# Patient Record
Sex: Male | Born: 1955 | Race: Black or African American | Hispanic: No | Marital: Married | Smoking: Former smoker
Health system: Southern US, Community
[De-identification: ages and names within clinical notes are randomized; demographics above are authoritative.]

## PROBLEM LIST (undated history)

## (undated) DIAGNOSIS — E119 Type 2 diabetes mellitus without complications: Secondary | ICD-10-CM

## (undated) DIAGNOSIS — Z87442 Personal history of urinary calculi: Secondary | ICD-10-CM

## (undated) DIAGNOSIS — J189 Pneumonia, unspecified organism: Secondary | ICD-10-CM

## (undated) DIAGNOSIS — E785 Hyperlipidemia, unspecified: Secondary | ICD-10-CM

## (undated) DIAGNOSIS — M1990 Unspecified osteoarthritis, unspecified site: Secondary | ICD-10-CM

## (undated) DIAGNOSIS — W3400XA Accidental discharge from unspecified firearms or gun, initial encounter: Secondary | ICD-10-CM

## (undated) DIAGNOSIS — I517 Cardiomegaly: Secondary | ICD-10-CM

## (undated) DIAGNOSIS — R201 Hypoesthesia of skin: Secondary | ICD-10-CM

## (undated) HISTORY — DX: Hyperlipidemia, unspecified: E78.5

## (undated) HISTORY — PX: NO PAST SURGERIES: SHX2092

---

## 1984-05-10 DIAGNOSIS — W3400XA Accidental discharge from unspecified firearms or gun, initial encounter: Secondary | ICD-10-CM

## 1984-05-10 HISTORY — DX: Accidental discharge from unspecified firearms or gun, initial encounter: W34.00XA

## 1998-05-23 ENCOUNTER — Emergency Department (HOSPITAL_COMMUNITY): Admission: EM | Admit: 1998-05-23 | Discharge: 1998-05-23 | Payer: Self-pay | Admitting: Emergency Medicine

## 2005-10-26 ENCOUNTER — Emergency Department (HOSPITAL_COMMUNITY): Admission: EM | Admit: 2005-10-26 | Discharge: 2005-10-26 | Payer: Self-pay | Admitting: Family Medicine

## 2005-11-10 ENCOUNTER — Emergency Department (HOSPITAL_COMMUNITY): Admission: EM | Admit: 2005-11-10 | Discharge: 2005-11-10 | Payer: Self-pay | Admitting: Emergency Medicine

## 2008-07-09 ENCOUNTER — Emergency Department (HOSPITAL_COMMUNITY): Admission: EM | Admit: 2008-07-09 | Discharge: 2008-07-09 | Payer: Self-pay | Admitting: Family Medicine

## 2010-08-20 LAB — POCT I-STAT, CHEM 8
Chloride: 105 mEq/L (ref 96–112)
HCT: 43 % (ref 39.0–52.0)
Hemoglobin: 14.6 g/dL (ref 13.0–17.0)
Sodium: 140 mEq/L (ref 135–145)

## 2013-02-09 ENCOUNTER — Encounter (HOSPITAL_COMMUNITY): Payer: Self-pay | Admitting: Emergency Medicine

## 2013-02-09 ENCOUNTER — Emergency Department (INDEPENDENT_AMBULATORY_CARE_PROVIDER_SITE_OTHER)
Admission: EM | Admit: 2013-02-09 | Discharge: 2013-02-09 | Disposition: A | Discharge status: Home / Self Care | Payer: Medicare Other | Source: Home / Self Care | Attending: Family Medicine | Admitting: Family Medicine

## 2013-02-09 DIAGNOSIS — E119 Type 2 diabetes mellitus without complications: Secondary | ICD-10-CM

## 2013-02-09 LAB — POCT I-STAT, CHEM 8
Creatinine, Ser: 1.1 mg/dL (ref 0.50–1.35)
HCT: 41 % (ref 39.0–52.0)
Hemoglobin: 13.9 g/dL (ref 13.0–17.0)
Sodium: 135 mEq/L (ref 135–145)
TCO2: 25 mmol/L (ref 0–100)

## 2013-02-09 MED ORDER — METFORMIN HCL 500 MG PO TABS: 500.0000 mg | ORAL_TABLET | Freq: Two times a day (BID) | ORAL | Status: DC

## 2013-02-09 NOTE — ED Provider Notes (Signed)
Mark Cook is a 57 y.o. male who presents to Urgent Care today for polyuria. Patient notes frequent urination over the last week or so. He occasionally feels somewhat dizzy. He checks his blood sugar with a relative's glucometer and noted her sugar readings in the 400s. He suspects that he may have diabetes. He has never been diagnosed before. He denies any chest pains palpitations or syncope. He notes that he frequently drinks sugar sweetened beverages. He feels well otherwise no nausea vomiting or diarrhea   History reviewed. No pertinent past medical history. History  Substance Use Topics  . Smoking status: Never Smoker   . Smokeless tobacco: Not on file  . Alcohol Use: Yes   ROS as above Medications reviewed. No current facility-administered medications for this encounter.   Current Outpatient Prescriptions  Medication Sig Dispense Refill  . metFORMIN (GLUCOPHAGE) 500 MG tablet Take 1 tablet (500 mg total) by mouth 2 (two) times daily with a meal.  60 tablet  0    Exam:  BP 119/70  Pulse 77  Temp(Src) 97.9 F (36.6 C) (Oral)  Resp 16  SpO2 98% Gen: Well NAD HEENT: EOMI,  MMM Lungs: CTABL Nl WOB Heart: RRR no MRG Abd: NABS, NT, ND Exts: Non edematous BL  LE, warm and well perfused.   Results for orders placed during the hospital encounter of 02/09/13 (from the past 24 hour(s))  POCT I-STAT, CHEM 8     Status: Abnormal   Collection Time    02/09/13  1:33 PM      Result Value Range   Sodium 135  135 - 145 mEq/L   Potassium 4.3  3.5 - 5.1 mEq/L   Chloride 99  96 - 112 mEq/L   BUN 13  6 - 23 mg/dL   Creatinine, Ser 1.61  0.50 - 1.35 mg/dL   Glucose, Bld 096 (*) 70 - 99 mg/dL   Calcium, Ion 0.45  4.09 - 1.23 mmol/L   TCO2 25  0 - 100 mmol/L   Hemoglobin 13.9  13.0 - 17.0 g/dL   HCT 81.1  91.4 - 78.2 %   No results found.  Assessment and Plan: 57 y.o. male with diabetes. Based on symptom of polyuria and blood glucose level of 337.  Plan: Initiate metformin low-dose,  and prescribed a glucometer with test strips. \ I have scheduled the patient an appointment with Dr. Piedad Climes of the [facility] on Wednesday of next week.  Discussed warning signs or symptoms. Please see discharge instructions. Patient expresses understanding.      Rodolph Bong, MD 02/09/13 [phone]

## 2013-02-09 NOTE — ED Notes (Signed)
Patient reports frequent urination and dizziness for one week.  Sister checked his "sugar" yesterday and reported cbg 490.  Patient denies history.

## 2013-02-09 NOTE — ED Notes (Signed)
[facility] called in regards to the diagnostic code for testing strips. Per Hayden Rasmussen NP ,the code was 250.01 relayed same to pharmacy.

## 2013-02-12 NOTE — ED Notes (Addendum)
[facility] pharmacy called and said they need a diagnosis code to fill the strips.  They said they talked to [place] on Friday.  She said it can't be called, it has to be written and signed. She said pt. is there now. They faxed the order to [place].  I gave it to Dr. Denyse Amass. He wrote 250.92 -uncontrolled diabetes on it and I faxed it back [phone] and confirmation recieved.  I called back and they said they are filling it now and thanked me. Vassie Moselle 02/12/2013

## 2013-02-14 ENCOUNTER — Emergency Department (INDEPENDENT_AMBULATORY_CARE_PROVIDER_SITE_OTHER)
Admission: EM | Admit: 2013-02-14 | Discharge: 2013-02-14 | Disposition: A | Discharge status: Home / Self Care | Payer: Medicare Other | Source: Home / Self Care | Attending: Emergency Medicine | Admitting: Emergency Medicine

## 2013-02-14 ENCOUNTER — Emergency Department (INDEPENDENT_AMBULATORY_CARE_PROVIDER_SITE_OTHER): Payer: Medicare Other

## 2013-02-14 ENCOUNTER — Encounter (HOSPITAL_COMMUNITY): Payer: Self-pay | Admitting: Emergency Medicine

## 2013-02-14 DIAGNOSIS — R079 Chest pain, unspecified: Secondary | ICD-10-CM

## 2013-02-14 DIAGNOSIS — S2341XA Sprain of ribs, initial encounter: Secondary | ICD-10-CM

## 2013-02-14 DIAGNOSIS — S2329XA Dislocation of other parts of thorax, initial encounter: Secondary | ICD-10-CM

## 2013-02-14 HISTORY — DX: Type 2 diabetes mellitus without complications: E11.9

## 2013-02-14 MED ORDER — HYDROCODONE-ACETAMINOPHEN 5-325 MG PO TABS
ORAL_TABLET | ORAL | Status: AC
  Filled 2013-02-14: qty 2

## 2013-02-14 MED ORDER — OXYCODONE-ACETAMINOPHEN 5-325 MG PO TABS: ORAL_TABLET | ORAL | Status: DC

## 2013-02-14 MED ORDER — HYDROCODONE-ACETAMINOPHEN 5-325 MG PO TABS
2.0000 | ORAL_TABLET | Freq: Once | ORAL | Status: AC
  Administered 2013-02-14: 2 via ORAL

## 2013-02-14 NOTE — ED Notes (Signed)
Pt reports  She  Swallowed    A    Tongue  Ring  sev  Hours  Ago  She is in no  Distress  Airway is  Intact         No  Coughing  She  Is  Speaking in  Complete  sentances

## 2013-02-14 NOTE — ED Notes (Signed)
pT  States  Felt  A  Pop  sev  Days  Ago  While  Turning        He  Now  Has  Pain      When  He   Takes  Deep  Breath

## 2013-02-14 NOTE — ED Provider Notes (Signed)
Chief Complaint:   Chief Complaint  Patient presents with  . Back Pain    History of Present Illness:   Mark Cook is a 57 year old male who presents with a two-day history of right chest and flank pain which extends from the axilla to the iliac crest. Seems centered around the lower, left, lateral rib cage area which is tender to touch. The pain is worse with movement, twisting, bending, deep inspiration, coughing, sneezing, and laughing. The patient denies any injury. He states he just twisted and felt a pop sensation in his lateral chest followed by severe pain. The pain is rated 8/10 in intensity. It sometimes radiates to the abdomen. He denies any pain in the head, neck, anterior chest, radiating down the left arm or left leg. There is no numbness, tingling, or weakness. He denies any shortness of breath, cough, or hemoptysis.  He was just seen here last week and diagnosed with new-onset diabetes and placed on metformin. He's been taking this regularly. His baseline blood sugar was 377. He's not been checking his blood sugars, but feels his blood sugars better. He denies polyuria, polydipsia, or blurry vision.  Review of Systems:  Other than noted above, the patient denies any of the following symptoms. Systemic:  No fever, chills, sweats, or fatigue. ENT:  No nasal congestion, rhinorrhea, or sore throat. Pulmonary:  No cough, wheezing, shortness of breath, sputum production, hemoptysis. Cardiac:  No palpitations, rapid heartbeat, dizziness, presyncope or syncope. GI:  No abdominal pain, heartburn, nausea, or vomiting. Ext:  No leg pain or swelling.  PMFSH:  Past medical history, family history, social history, meds, and allergies were reviewed and updated as needed.   Physical Exam:   Vital signs:  BP 127/78  Pulse 68  Temp(Src) 98.1 F (36.7 C) (Oral)  Resp 28  SpO2 97% Gen:  Alert, oriented, in no distress, skin warm and dry. Eye:  PERRL, lids and conjunctivas normal.  Sclera  non-icteric. ENT:  Mucous membranes moist, pharynx clear. Neck:  Supple, no adenopathy or tenderness.  No JVD. Lungs:  Clear to auscultation, no wheezes, rales or rhonchi.  No respiratory distress. Heart:  Regular rhythm.  No gallops, murmers, clicks or rubs. Chest:  He has exquisite chest wall tenderness to palpation over the left, lower, lateral chest area. No swelling, bruising, or deformity. Abdomen:  Soft, nontender, no organomegaly or mass.  Bowel sounds normal.  No pulsatile abdominal mass or bruit. Ext:  No edema.  No calf tenderness and Homann's sign negative.  Pulses full and equal. Skin:  Warm and dry.  No rash.  Labs:   Results for orders placed during the hospital encounter of 02/14/13  GLUCOSE, CAPILLARY      Result Value Range   Glucose-Capillary 175 (*) 70 - 99 mg/dL     Radiology:  Dg Ribs Unilateral W/chest Right  02/14/2013   CLINICAL DATA:  Right-sided flank pain  EXAM: RIGHT RIBS AND CHEST - 3+ VIEW  COMPARISON:  None.  FINDINGS: Low volumes. Bibasilar atelectasis right greater than left. Upper normal heart size. No acute fracture. No dislocation. Ribs are grossly intact  IMPRESSION: No active cardiopulmonary disease. No obvious acute rib fracture   Electronically Signed   By: Maryclare Bean M.D.   On: 02/14/2013 16:10   I reviewed the images independently and personally and concur with the radiologist's findings.  Course in Urgent Care Center:   Given Norco 5/325 2 tablets by mouth for pain.  Assessment:  The encounter  diagnosis was Costochondral separation, initial encounter.  No evidence of rib fracture on x-Stepen. The point of maximal tenderness as identified by a BB marker was adjacent to the costochondral junction, suggesting costochondral separation.  Plan:   1.  Meds:  The following meds were prescribed:   Discharge Medication List as of 02/14/2013  4:36 PM    START taking these medications   Details  oxyCODONE-acetaminophen (PERCOCET) 5-325 MG per tablet 1 to 2  tablets every 6 hours as needed for pain., Print        2.  Patient Education/Counseling:  The patient was given appropriate handouts, self care instructions, and instructed in symptomatic relief.  Told to avoid vigorous activity and do deep breathing with a cough 4 times daily.  3.  Follow up:  The patient was told to follow up if no better in 3 to 4 days, if becoming worse in any way, and give an an some red flag symptoms such as fever or difficulty breathing which would prompt immediate return.  Follow up here as needed.     Reuben Likes, MD 02/14/13 [phone]

## 2013-02-15 ENCOUNTER — Encounter: Payer: Self-pay | Admitting: Emergency Medicine

## 2013-02-15 ENCOUNTER — Ambulatory Visit (INDEPENDENT_AMBULATORY_CARE_PROVIDER_SITE_OTHER): Payer: Medicare Other | Admitting: Emergency Medicine

## 2013-02-15 VITALS — BP 118/70 | HR 72 | Temp 97.6°F | Ht 76.0 in | Wt 250.0 lb

## 2013-02-15 DIAGNOSIS — E119 Type 2 diabetes mellitus without complications: Secondary | ICD-10-CM | POA: Insufficient documentation

## 2013-02-15 DIAGNOSIS — IMO0001 Reserved for inherently not codable concepts without codable children: Secondary | ICD-10-CM

## 2013-02-15 DIAGNOSIS — R0789 Other chest pain: Secondary | ICD-10-CM

## 2013-02-15 DIAGNOSIS — R079 Chest pain, unspecified: Secondary | ICD-10-CM

## 2013-02-15 DIAGNOSIS — R0781 Pleurodynia: Secondary | ICD-10-CM | POA: Insufficient documentation

## 2013-02-15 LAB — POCT GLYCOSYLATED HEMOGLOBIN (HGB A1C): Hemoglobin A1C: 9.6

## 2013-02-15 LAB — POCT UA - MICROALBUMIN
Albumin/Creatinine Ratio, Urine, POC: <30
Creatinine, POC: 200 mg/dL

## 2013-02-15 MED ORDER — ACCU-CHEK SOFT TOUCH LANCETS MISC: Status: DC

## 2013-02-15 MED ORDER — METFORMIN HCL 500 MG PO TABS: 500.0000 mg | ORAL_TABLET | Freq: Two times a day (BID) | ORAL | Status: DC

## 2013-02-15 MED ORDER — GLUCOSE BLOOD VI STRP: ORAL_STRIP | Status: DC

## 2013-02-15 NOTE — Patient Instructions (Signed)
It was nice to meet you!  Keep taking the metformin 1 pill with breakfast and 1 pill with dinner. You only need to check your sugar once or twice a week first thing in the morning.  You can also check if you think your sugar is too low or getting too high. Please get a pumice stone (at [facility] or the drugstore) and use it daily on any calluses on your feet.  We are checking some blood work today.  I will call you if anything is wrong, otherwise you will get a letter with the results in the mail in 1-2 weeks.  Follow up in 3 months.  Monitoring for Diabetes There are two blood tests that help you monitor and manage your diabetes. These include:  An A1c (hemoglobin A1c) test.  This test is an average of your glucose (or blood sugar) control over the past 3 months.  This is recommended as a way for you and your caregiver to understand how well your glucose levels are controlled on the average.  Your A1c goal will be determined by your caregiver, but it is usually best if it is less than 6.5% to 7.0%.  Glucose (sugar) attaches itself to red blood cells. The amount of glucose then can then be measured. The amount of glucose on the cells depends on how high your blood glucose has been.  SMBG test (self-monitoring blood glucose).  Using a blood glucose monitor (meter) to do SMBG testing is an easy way to monitor the amount of glucose in your blood and can help you improve your control. The monitor will tell you what your blood glucose is at that very moment. Every person with diabetes should have a blood glucose monitor and know how to use it. The better you control your blood sugar on a daily basis, the better your A1c levels will be. HOW OFTEN SHOULD I HAVE AN A1C LEVEL?  Every 3 months if your diabetes is not well controlled or if therapy has changed.  Every 6 months if you are meeting your treatment goals. HOW OFTEN SHOULD I DO SMBG TESTING?  Your caregiver will recommend how often you  should test. Testing times are based on the kind of medicine you take, type of diabetes you have, and your blood glucose control. Testing times can include:  Type 1 diabetes: test 3 or 4 times a day or as directed.  Type 2 diabetes and if you are taking insulin and diabetes pills: test 3 or 4 times a day or as directed.  If you are taking diabetes pills only and not reaching your target A1c: test 2 to 4 times a day or as directed.  If you are taking diabetes pills and are controlling your diabetes well with diet and exercise, your caregiver will help you decide what is appropriate. WHAT TIME OF DAY SHOULD I TEST?  The best time of day to test your blood glucose depends on medications, mealtimes, exercise, and blood glucose control. It is best to test at different times because this will help you know how you are doing throughout the day. Your caregiver will help you decide what is best. WHAT SHOULD MY BLOOD GLUCOSE BE? Blood glucose target goals may vary depending on each persons needs, whether they have type 1 or type 2 diabetes or what medications they are taking. However, as a general rule, blood glucose should be:  Before meals   70-130 mg/dl.  After meals    ..less than 180  mg/dl. CHECK YOUR BLOOD GLUCOSE IF:  You have symptoms of low blood sugar (hypoglycemia), which may include dizziness, shaking, sweating, chills and confusion.  You have symptoms of high blood sugar (hyperglycemia), which may include sleepiness, blurred vision, frequent urination and excessive thirst.  You are learning how meals, physical activity and medicine affect your blood glucose level. The more you learn about how various foods, your medications, and activities affect you, the better job you will do of taking care of yourself.  You have a job in which poor control could cause safety problems while driving or operating machinery. CHECK YOUR BLOOD SUGAR MORE FREQUENTLY:  If you have medication or dietary  changes.  If you begin taking other kinds of medicines.  If you become sick or your level of stress increases. With an illness, your blood sugar may even be high without eating.  Before and after exercise. Follow your caregiver's testing recommendations during this time.  TO DISPOSE OF SHARPS: Each city or state may have different regulations. Check with your public works or Engineer, structural.  Sharps containers can be purchased from pharmacies.  Place all used sharps in a container. You do not need to replace any protective covers over the needle or break the needle.  Sharps should be contained in a ridge, leakproof, puncture-resistant container.  Plastic detergent bottle.  Bleach bottle.  When container is almost full, add a solution that is 1 part laundry bleach and 9 parts tap water (it is okay to use undiluted bleach if you wish). You may want to wear gloves since bleach can damage tissue. Let the solution sit for 30 minutes.  Carefully pour all the liquid into the sanitary sewer. Be sure to prevent the sharps from falling out.  Once liquid is drained, reseal the container with lid and tape it shut with duct tape. This will prevent the cap from coming off.  Dispose of the container with your regular household trash and waste. It is a good idea to let your trash hauler know that you will be disposing of sharps. Document Released: 04/29/2003 Document Revised: 01/19/2012 Document Reviewed: 10/28/2008 [facility] Patient Information 2014 [place], [place].

## 2013-02-15 NOTE — Assessment & Plan Note (Addendum)
New diagnosis. Will check A1c, CMP, Lipids, microalbumin today. Continue metformin 500mg  BID for now. Provided education on consequences of diabetes and checking CBGs (only needs to check 1-2x/week or if symptomatic). Follow up in 3 months.

## 2013-02-15 NOTE — Progress Notes (Signed)
  Subjective:    Patient ID: Mark Cook, male    DOB: 1955-12-01, 57 y.o.   MRN: [id number]  HPI Mark Cook is here for a new patient appointment after being diagnosed with diabetes.  I have reviewed and updated the following as appropriate: allergies, current medications, past family history, past medical history, past social history, past surgical history and problem list PMHx: new diabetes FHx: diabetes and HTN in siblings SHx: former smoker  Diabetes He reports that he had been experiencing polyuria and polydipsia.  He was seen at Urgent Care and had a CBG in the 300s.  He was started on metformin 500mg  BID.  Since starting this medicine, he reports much improvement in the polyuria and polydipsia.  He also has some trouble with his vision.  He saw an eye doctor several months ago and was given glasses.  Now, he thinks the glasses are too strong for him.    Review of Systems Patient Information Form: Screening and ROS  Do you feel safe in relationships? yes PHQ-2: positive for anhedonia only  Review of Symptoms  General:  Negative for nexplained weight loss, fever Skin: Negative for new or changing mole, sore that won't heal HEENT: Negative for trouble hearing, + trouble seeing, ringing in ears, mouth sores, hoarseness, change in voice, dysphagia. CV:  Negative for chest pain, dyspnea, edema, palpitations Resp: Negative for cough, dyspnea, hemoptysis GI: Negative for nausea, vomiting, diarrhea, constipation, abdominal pain, melena, hematochezia. GU: Negative for dysuria, incontinence, urinary hesitance, hematuria, vaginal or penile discharge, polyuria, sexual difficulty, lumps in testicle or breasts MSK: Negative for muscle cramps or aches, joint pain or swelling Neuro: Negative for headaches, weakness, numbness, +dizziness, passing out/fainting Psych: Negative for depression, anxiety, memory problems      Objective:   Physical Exam BP 118/70  Pulse 72  Temp(Src) 97.6 F  (36.4 C) (Oral)  Ht 6\' 4"  (1.93 m)  Wt 250 lb (113.399 kg)  BMI 30.44 kg/m2 Gen: alert, cooperative, NAD HEENT: AT/[place], sclera white, MMM, no pharyngeal erythema or exudate; PERRL, no obvious abnormalities on fundoscopic exam Neck: supple, no LAD, thyroid normal CV: RRR, no murmurs Pulm: CTAB, no wheezes or rales Abd: +BS, soft, NTND Ext: no edema, 2+ DP pulses bilaterally Feet: calluses on base of great and 3rd toes     Assessment & Plan:

## 2013-02-16 ENCOUNTER — Encounter: Payer: Self-pay | Admitting: Emergency Medicine

## 2013-02-16 LAB — COMPREHENSIVE METABOLIC PANEL
Albumin: 4.3 g/dL (ref 3.5–5.2)
BUN: 13 mg/dL (ref 6–23)
Glucose, Bld: 184 mg/dL — ABNORMAL HIGH (ref 70–99)
Sodium: 135 mEq/L (ref 135–145)
Total Protein: 7 g/dL (ref 6.0–8.3)

## 2013-02-16 LAB — LIPID PANEL: Triglycerides: 187 mg/dL — ABNORMAL HIGH (ref <150)

## 2013-02-22 ENCOUNTER — Telehealth: Payer: Self-pay | Admitting: Emergency Medicine

## 2013-02-22 NOTE — Telephone Encounter (Signed)
Pt dropped off form to be filled out regarding disability parking placard. °

## 2013-02-22 NOTE — Telephone Encounter (Signed)
Handicap placard form placed in Dr. Harrel Lemon box per patient request.  Radene Ou, CMA

## 2013-02-23 NOTE — Telephone Encounter (Signed)
Pt called and notified of handicap placard available for pick up.  Pt had questions as to why it was not filled out for a permanent one because he has been on disability since 1986.  I told patient he could discuss with MD at a scheduled OV.  Pt agreeable.  Jamirah Zelaya, Darlyne Russian, CMA

## 2013-02-23 NOTE — Telephone Encounter (Signed)
Filled out placard for temporary permit for 1 month for right rib and flank pain.  Form placed up front for pick up.

## 2013-03-21 ENCOUNTER — Telehealth: Payer: Self-pay | Admitting: Emergency Medicine

## 2013-03-21 NOTE — Telephone Encounter (Signed)
Forward to PCP for refill request 

## 2013-03-21 NOTE — Telephone Encounter (Signed)
Pt brought in med bottles and needs refills on metformin and percocet. [facility] [place] Please advise

## 2013-03-22 MED ORDER — METFORMIN HCL 500 MG PO TABS: 500.0000 mg | ORAL_TABLET | Freq: Two times a day (BID) | ORAL | Status: DC

## 2013-03-22 NOTE — Telephone Encounter (Signed)
I have sent refills of the metformin to [facility].  He will need to make an appointment to discuss the percocet.

## 2013-03-22 NOTE — Telephone Encounter (Signed)
Called pt 'voice mail box not set up yet'. Waiting for call back. Please see message. Thanks. Lorenda Hatchet, Renato Battles

## 2013-09-03 ENCOUNTER — Ambulatory Visit (INDEPENDENT_AMBULATORY_CARE_PROVIDER_SITE_OTHER): Payer: Medicare Other | Admitting: Family Medicine

## 2013-09-03 ENCOUNTER — Encounter: Payer: Self-pay | Admitting: Family Medicine

## 2013-09-03 VITALS — BP 129/75 | HR 59 | Temp 97.7°F | Ht 76.0 in | Wt 254.0 lb

## 2013-09-03 DIAGNOSIS — E1165 Type 2 diabetes mellitus with hyperglycemia: Secondary | ICD-10-CM

## 2013-09-03 DIAGNOSIS — L0390 Cellulitis, unspecified: Secondary | ICD-10-CM

## 2013-09-03 DIAGNOSIS — IMO0002 Reserved for concepts with insufficient information to code with codable children: Secondary | ICD-10-CM

## 2013-09-03 DIAGNOSIS — N529 Male erectile dysfunction, unspecified: Secondary | ICD-10-CM

## 2013-09-03 DIAGNOSIS — L02412 Cutaneous abscess of left axilla: Secondary | ICD-10-CM | POA: Insufficient documentation

## 2013-09-03 DIAGNOSIS — IMO0001 Reserved for inherently not codable concepts without codable children: Secondary | ICD-10-CM

## 2013-09-03 DIAGNOSIS — L0291 Cutaneous abscess, unspecified: Secondary | ICD-10-CM

## 2013-09-03 MED ORDER — SILDENAFIL CITRATE 100 MG PO TABS: 50.0000 mg | ORAL_TABLET | Freq: Every day | ORAL | Status: DC | PRN

## 2013-09-03 MED ORDER — METFORMIN HCL 500 MG PO TABS: 500.0000 mg | ORAL_TABLET | Freq: Two times a day (BID) | ORAL | Status: DC

## 2013-09-03 MED ORDER — DOXYCYCLINE HYCLATE 100 MG PO TABS: 100.0000 mg | ORAL_TABLET | Freq: Two times a day (BID) | ORAL | Status: DC

## 2013-09-03 NOTE — Assessment & Plan Note (Signed)
A: abscess with surrounding cellulitis P:  - I&D performed in office - Given rx for doxycycline - F/u 1 week

## 2013-09-03 NOTE — Assessment & Plan Note (Signed)
Rx viagra since previously well tolerated

## 2013-09-03 NOTE — Progress Notes (Signed)
   Subjective:    Patient ID: Mark Cook, male    DOB: 03/25/1956, 58 y.o.   MRN: [id number]  HPI  Rash:  Location: left arm Duration: 1 week Character: red, painful, tender Changes: drainage, worsening  Treatments tried: no History of similar rash: no Exposure/Infestation Concern: no  Red Flags: New Drug: started on Metformin several months ago Mucocutaneous Involvement: no Fever/Systemic Illness: no   Sexual Dysfunction: 3 years duration, difficulty getting and maintaining an erection, has tried Viagra successfully in the past and would like to use that again, only side effect with temporary blurred vision, he is not taking nitrate for chest pain, no hx of hypotension    Review of Systems See HPI    Objective:   Physical Exam BP 129/75  Pulse 59  Temp(Src) 97.7 F (36.5 C) (Oral)  Ht 6\' 4"  (1.93 m)  Wt 254 lb (115.214 kg)  BMI 30.93 kg/m2 Gen: well appearing middle aged male, non distressed Oral Mucosa: no involvement  Skin: abscess of left axilla with surrounding erythema and tenderness consistent with cellulitis     Assessment & Plan:  Procedure Note:   Procedure: Incision and drainage of abscess Location: left axillae Consent obtained:  Procedure: Area cleaned and prepped with betadine solution. Anesthetized with 8 mL of 2% lidocaine with epinephrine. Incised with No. 11 blade scalpel. Findings included purulent drainage. The abscess was not packed with iodoform packing. Estimated blood loss 25 ml. Patient tolerated procedure well and was given instructions for after care.

## 2013-09-03 NOTE — Patient Instructions (Signed)
Dear Sallis,   ThankTomma Lightning you for coming to clinic today. Please read below regarding the issues that we discussed.   1. Left arm pit infection - We have drained the main infection, but you still need to take antibiotic tablets for 10 days. Please follow up in 1 week to make sure it is healing or sooner if you have fever or worsening pain.   2. Erectile dysfunction - There is a prescription for viagra at the pharmacy.  3.  Diabetes - Please take the metformin twice daily every day.   Please follow up in clinic in 1 week. Please call earlier if you have any questions or concerns.   Sincerely,   Dr. Clinton SawyerWilliamson

## 2013-09-18 ENCOUNTER — Encounter: Payer: Self-pay | Admitting: Emergency Medicine

## 2013-09-18 ENCOUNTER — Ambulatory Visit (INDEPENDENT_AMBULATORY_CARE_PROVIDER_SITE_OTHER): Payer: Medicare Other | Admitting: Emergency Medicine

## 2013-09-18 VITALS — BP 126/77 | HR 64 | Temp 97.7°F | Ht 76.0 in | Wt 253.0 lb

## 2013-09-18 DIAGNOSIS — IMO0001 Reserved for inherently not codable concepts without codable children: Secondary | ICD-10-CM

## 2013-09-18 DIAGNOSIS — L0291 Cutaneous abscess, unspecified: Secondary | ICD-10-CM

## 2013-09-18 DIAGNOSIS — R109 Unspecified abdominal pain: Secondary | ICD-10-CM

## 2013-09-18 DIAGNOSIS — R5381 Other malaise: Secondary | ICD-10-CM

## 2013-09-18 DIAGNOSIS — R102 Pelvic and perineal pain: Secondary | ICD-10-CM

## 2013-09-18 DIAGNOSIS — L02412 Cutaneous abscess of left axilla: Secondary | ICD-10-CM

## 2013-09-18 DIAGNOSIS — L0390 Cellulitis, unspecified: Secondary | ICD-10-CM

## 2013-09-18 DIAGNOSIS — E1165 Type 2 diabetes mellitus with hyperglycemia: Principal | ICD-10-CM

## 2013-09-18 DIAGNOSIS — IMO0002 Reserved for concepts with insufficient information to code with codable children: Secondary | ICD-10-CM

## 2013-09-18 DIAGNOSIS — R5383 Other fatigue: Secondary | ICD-10-CM | POA: Insufficient documentation

## 2013-09-18 LAB — POCT URINALYSIS DIPSTICK
BILIRUBIN UA: NEGATIVE
Blood, UA: NEGATIVE
Glucose, UA: NEGATIVE
KETONES UA: NEGATIVE
Leukocytes, UA: NEGATIVE
NITRITE UA: NEGATIVE
PROTEIN UA: NEGATIVE
SPEC GRAV UA: 1.02
UROBILINOGEN UA: 1
pH, UA: 6

## 2013-09-18 LAB — POCT GLYCOSYLATED HEMOGLOBIN (HGB A1C): HEMOGLOBIN A1C: 6.6

## 2013-09-18 MED ORDER — DOXYCYCLINE HYCLATE 100 MG PO TABS: 100.0000 mg | ORAL_TABLET | Freq: Two times a day (BID) | ORAL | Status: DC

## 2013-09-18 NOTE — Assessment & Plan Note (Signed)
Described as decreased exercise tolerance.  Also with signs of potential sleep apnea. Will check TSH, CBC and sleep study. F/u in 2-3 weeks to review results.

## 2013-09-18 NOTE — Assessment & Plan Note (Signed)
A1c 6.6%. Continue with dietary modifications and metformin 500mg  BID. F/u in 3 months.
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
## 2013-09-18 NOTE — Assessment & Plan Note (Signed)
Associated with some urinary symptoms. With his spinal cord injury, he does not have normal sensation in the saddle area. UA was normal. Given his symptoms, I am going to treat presumptively for prostatitis. Doxycycline 100mg  BID x3 weeks. F/u in 2-3 weeks.

## 2013-09-18 NOTE — Patient Instructions (Signed)
It was nice to see you!  Your diabetes is excellent!  Your A1c is 6.6% (the goal is <7)!  Fatigue... 1. We are checking blood work today. 2. You should get a call to set up a sleep study in the next week. 3. Follow up in 2-3 weeks to go over results.  Belly discomfort... 1. We are going to treat you for an infection in your prostate. 2. Take doxycycline 1 pill twice a day for 3 weeks.  I will see you back in 2-3 weeks.

## 2014-02-14 ENCOUNTER — Ambulatory Visit (INDEPENDENT_AMBULATORY_CARE_PROVIDER_SITE_OTHER): Payer: Medicare Other | Admitting: Family Medicine

## 2014-02-14 ENCOUNTER — Encounter: Payer: Self-pay | Admitting: Family Medicine

## 2014-02-14 ENCOUNTER — Ambulatory Visit (HOSPITAL_COMMUNITY)
Admission: RE | Admit: 2014-02-14 | Discharge: 2014-02-14 | Disposition: A | Discharge status: Home / Self Care | Payer: Medicare Other | Source: Ambulatory Visit | Attending: Family Medicine | Admitting: Family Medicine

## 2014-02-14 VITALS — BP 113/68 | HR 65 | Temp 97.8°F | Resp 20 | Wt 243.0 lb

## 2014-02-14 DIAGNOSIS — K760 Fatty (change of) liver, not elsewhere classified: Secondary | ICD-10-CM | POA: Diagnosis not present

## 2014-02-14 DIAGNOSIS — R319 Hematuria, unspecified: Secondary | ICD-10-CM | POA: Insufficient documentation

## 2014-02-14 LAB — POCT UA - MICROSCOPIC ONLY

## 2014-02-14 LAB — POCT URINALYSIS DIPSTICK
BILIRUBIN UA: NEGATIVE
GLUCOSE UA: NEGATIVE
Ketones, UA: NEGATIVE
Leukocytes, UA: NEGATIVE
NITRITE UA: NEGATIVE
PH UA: 5.5
Protein, UA: 100
Spec Grav, UA: 1.015
Urobilinogen, UA: 0.2

## 2014-02-14 MED ORDER — TAMSULOSIN HCL 0.4 MG PO CAPS: 0.4000 mg | ORAL_CAPSULE | Freq: Every day | ORAL | Status: DC

## 2014-02-14 MED ORDER — OXYCODONE-ACETAMINOPHEN 10-325 MG PO TABS
1.0000 | ORAL_TABLET | ORAL | Status: DC | PRN
Start: 2014-02-14 — End: 2014-10-02

## 2014-02-14 NOTE — Assessment & Plan Note (Signed)
Most likely 2/2 nephroliathiasis.  Will obtain BMET to evaluate for renal obstruction, CBC for anemia, CT w/o contrast of abdomen, and if stone present and > 10 mm, will refer to urology.  Otherwise, continue with hydration, can consider flomax 0.4 mg daily, and will f/u in 5-7 days to make sure hematuria has cleared.  No new medications making interstitial nephritis less of a concern or hemorrhage from anticoagulation, afebrile making pyelo less likely along with no CVA tenderness. 10 10-325 percocet given.

## 2014-02-14 NOTE — Progress Notes (Signed)
Mark Cook is a 58 y.o. male who presents today for hematuria, flank pain.  Hematuria/Flank Pain - Started three days ago, located on R flank, denies fevers, never had before, and started having gross hematuria at that time.  Pain has since subsided slightly but yesterday, limited him to bed despite multiple doses of tylenol.  Denies fever, chills, sweats, diaphoresis, decreased urination, weight loss, fatigue.  Currently does not smoke and denies any new medications.    Past Medical History  Diagnosis Date  . Diabetes mellitus without complication     History  Smoking status  . Former Smoker  Smokeless tobacco  . Never Used    Family History  Problem Relation Age of Onset  . Hypertension Mother   . Hypertension Sister   . Diabetes Sister     Current Outpatient Prescriptions on File Prior to Visit  Medication Sig Dispense Refill  . doxycycline (VIBRA-TABS) 100 MG tablet Take 1 tablet (100 mg total) by mouth 2 (two) times daily.  42 tablet  0  . glucose blood (ACCU-CHEK AVIVA) test strip Use as instructed  100 each  12  . Lancets (ACCU-CHEK SOFT TOUCH) lancets Use as instructed  100 each  12  . metFORMIN (GLUCOPHAGE) 500 MG tablet Take 1 tablet (500 mg total) by mouth 2 (two) times daily with a meal.  60 tablet  11  . sildenafil (VIAGRA) 100 MG tablet Take 0.5-1 tablets (50-100 mg total) by mouth daily as needed for erectile dysfunction.  5 tablet  11   No current facility-administered medications on file prior to visit.    ROS: Per HPI.  All other systems reviewed and are negative.   Physical Exam Filed Vitals:   02/14/14 1548  BP: 113/68  Pulse: 65  Temp: 97.8 F (36.6 C)  Resp: 20    Physical Examination: General appearance - alert, well appearing, and in no distress Heart - normal rate, regular rhythm, normal S1, S2, no murmurs, rubs, clicks or gallops Abdomen - Soft/NT/ND, no CVA tenderness, no groin tenderness with ROM, NABS, no HSM     Chemistry       Component Value Date/Time   NA 135 02/15/2013 1457   K 4.5 02/15/2013 1457   CL 100 02/15/2013 1457   CO2 30 02/15/2013 1457   BUN 13 02/15/2013 1457   CREATININE 1.12 02/15/2013 1457   CREATININE 1.10 02/09/2013 1333      Component Value Date/Time   CALCIUM 9.3 02/15/2013 1457   ALKPHOS 60 02/15/2013 1457   AST 25 02/15/2013 1457   ALT 26 02/15/2013 1457   BILITOT 0.7 02/15/2013 1457      Lab Results  Component Value Date   HGB 13.9 02/09/2013   HCT 41.0 02/09/2013

## 2014-02-14 NOTE — Patient Instructions (Signed)
Ureteral Colic Ureteral colic is spasm-like pain from the kidney or the ureter. This is often caused by a kidney stone. The pain is caused by the stone trying to get through the tubes that pass your pee. HOME CARE   Drink enough fluids to keep your pee (urine) clear or pale yellow.  Strain all your pee. A strainer will be provided. Keep anything caught in the strainer and bring it to your doctor. The stone causing the pain may be very small.  Only take medicine as told by your doctor.  Follow up with your doctor as told. GET HELP RIGHT AWAY IF:   Pain is not controlled with medicine.  Pain continues or gets worse.  The pain changes and there is chest or belly (abdominal) pain.  You pass out (faint).  You cannot pee.  You keep throwing up (vomiting).  You have a temperature by mouth above 102 F (38.9 C), not controlled by medicine. MAKE SURE YOU:   Understand these instructions.  Will watch this condition.  Will get help right away if you are not doing well or get worse. Document Released: 10/13/2007 Document Revised: 07/19/2011 Document Reviewed: 10/13/2007 ExitCare Patient Information 2015 [employer]. This information is not intended to replace advice given to you by your health care provider. Make sure you discuss any questions you have with your health care provider.  

## 2014-02-14 NOTE — Addendum Note (Signed)
Addended by: Briscoe DeutscherHESS, Miche Loughridge R on: 02/14/2014 04:29 PM   Modules accepted: Orders

## 2014-02-15 LAB — CBC
HEMATOCRIT: 41.7 % (ref 39.0–52.0)
HEMOGLOBIN: 14.3 g/dL (ref 13.0–17.0)
MCH: 29.2 pg (ref 26.0–34.0)
MCHC: 34.3 g/dL (ref 30.0–36.0)
MCV: 85.3 fL (ref 78.0–100.0)
Platelets: 192 10*3/uL (ref 150–400)
RBC: 4.89 MIL/uL (ref 4.22–5.81)
RDW: 15.3 % (ref 11.5–15.5)
WBC: 5.9 10*3/uL (ref 4.0–10.5)

## 2014-02-15 LAB — BASIC METABOLIC PANEL
BUN: 17 mg/dL (ref 6–23)
CALCIUM: 9.5 mg/dL (ref 8.4–10.5)
CHLORIDE: 103 meq/L (ref 96–112)
CO2: 25 mEq/L (ref 19–32)
Creat: 1.18 mg/dL (ref 0.50–1.35)
GLUCOSE: 104 mg/dL — AB (ref 70–99)
POTASSIUM: 4.2 meq/L (ref 3.5–5.3)
Sodium: 139 mEq/L (ref 135–145)

## 2014-02-21 ENCOUNTER — Ambulatory Visit: Payer: Medicare Other | Admitting: Family Medicine

## 2014-09-19 ENCOUNTER — Other Ambulatory Visit: Payer: Self-pay | Admitting: Family Medicine

## 2014-09-19 DIAGNOSIS — E119 Type 2 diabetes mellitus without complications: Secondary | ICD-10-CM

## 2014-09-19 MED ORDER — METFORMIN HCL 500 MG PO TABS: 500.0000 mg | ORAL_TABLET | Freq: Two times a day (BID) | ORAL | Status: DC

## 2014-09-19 NOTE — Telephone Encounter (Signed)
Patient requests refill for metformin 500 mg. Please, follow up with Patient

## 2014-09-19 NOTE — Telephone Encounter (Signed)
Patient has not been seen for T2DM f/u in 1 year.  Will refill x1.  Patient needs f/u in clinic for more refills and ongoing diabetes management.  Erasmo DownerAngela M Bacigalupo, MD, MPH PGY-1,  [facility] 09/19/2014 12:07 PM

## 2014-09-24 NOTE — Telephone Encounter (Signed)
Left message on voicemail for patient to call back and schedule an appointment.

## 2014-10-02 ENCOUNTER — Encounter: Payer: Self-pay | Admitting: Family Medicine

## 2014-10-02 ENCOUNTER — Ambulatory Visit (INDEPENDENT_AMBULATORY_CARE_PROVIDER_SITE_OTHER): Payer: Medicare Other | Admitting: Family Medicine

## 2014-10-02 ENCOUNTER — Other Ambulatory Visit (HOSPITAL_COMMUNITY)
Admission: RE | Admit: 2014-10-02 | Discharge: 2014-10-02 | Disposition: A | Discharge status: Home / Self Care | Payer: Medicare Other | Source: Ambulatory Visit | Attending: Family Medicine | Admitting: Family Medicine

## 2014-10-02 VITALS — BP 137/75 | HR 70 | Temp 98.3°F | Ht 76.0 in | Wt 237.0 lb

## 2014-10-02 DIAGNOSIS — E119 Type 2 diabetes mellitus without complications: Secondary | ICD-10-CM

## 2014-10-02 DIAGNOSIS — R911 Solitary pulmonary nodule: Secondary | ICD-10-CM | POA: Diagnosis not present

## 2014-10-02 DIAGNOSIS — Z202 Contact with and (suspected) exposure to infections with a predominantly sexual mode of transmission: Secondary | ICD-10-CM | POA: Diagnosis not present

## 2014-10-02 DIAGNOSIS — R319 Hematuria, unspecified: Secondary | ICD-10-CM | POA: Diagnosis not present

## 2014-10-02 DIAGNOSIS — Z114 Encounter for screening for human immunodeficiency virus [HIV]: Secondary | ICD-10-CM

## 2014-10-02 DIAGNOSIS — R1030 Lower abdominal pain, unspecified: Secondary | ICD-10-CM

## 2014-10-02 DIAGNOSIS — Z113 Encounter for screening for infections with a predominantly sexual mode of transmission: Secondary | ICD-10-CM | POA: Insufficient documentation

## 2014-10-02 LAB — BASIC METABOLIC PANEL
BUN: 16 mg/dL (ref 6–23)
CALCIUM: 9.9 mg/dL (ref 8.4–10.5)
CO2: 27 mEq/L (ref 19–32)
CREATININE: 0.98 mg/dL (ref 0.50–1.35)
Chloride: 103 mEq/L (ref 96–112)
Glucose, Bld: 85 mg/dL (ref 70–99)
POTASSIUM: 4.7 meq/L (ref 3.5–5.3)
SODIUM: 141 meq/L (ref 135–145)

## 2014-10-02 LAB — CBC WITH DIFFERENTIAL/PLATELET
BASOS ABS: 0 10*3/uL (ref 0.0–0.1)
BASOS PCT: 1 % (ref 0–1)
Eosinophils Absolute: 0 10*3/uL (ref 0.0–0.7)
Eosinophils Relative: 1 % (ref 0–5)
HCT: 43.5 % (ref 39.0–52.0)
Hemoglobin: 14.9 g/dL (ref 13.0–17.0)
Lymphocytes Relative: 48 % — ABNORMAL HIGH (ref 12–46)
Lymphs Abs: 2.2 10*3/uL (ref 0.7–4.0)
MCH: 29.4 pg (ref 26.0–34.0)
MCHC: 34.3 g/dL (ref 30.0–36.0)
MCV: 86 fL (ref 78.0–100.0)
MONO ABS: 0.4 10*3/uL (ref 0.1–1.0)
MPV: 9.9 fL (ref 8.6–12.4)
Monocytes Relative: 9 % (ref 3–12)
NEUTROS ABS: 1.8 10*3/uL (ref 1.7–7.7)
Neutrophils Relative %: 41 % — ABNORMAL LOW (ref 43–77)
Platelets: 178 10*3/uL (ref 150–400)
RBC: 5.06 MIL/uL (ref 4.22–5.81)
RDW: 15.1 % (ref 11.5–15.5)
WBC: 4.5 10*3/uL (ref 4.0–10.5)

## 2014-10-02 LAB — RPR

## 2014-10-02 MED ORDER — TRAMADOL HCL 50 MG PO TABS: 50.0000 mg | ORAL_TABLET | Freq: Three times a day (TID) | ORAL | Status: DC | PRN

## 2014-10-02 MED ORDER — METFORMIN HCL 500 MG PO TABS
500.0000 mg | ORAL_TABLET | Freq: Two times a day (BID) | ORAL | Status: DC
Start: 2014-10-02 — End: 2015-10-23

## 2014-10-02 NOTE — Assessment & Plan Note (Signed)
Pt with recent sexual encounter with known partner with chlamydia.  However, he is having RLQ tenderness as well.  Will obtain BMET/CBC along with HIV/Syphilis/Gonorrhea/Chlamydia.  Low concern for intraabdominal process but he has never had intraoperative procedure of the abdomen.  - Check labs - If starts having anorexia, fever, nausea/vomiting, severe McBurney's point TTP, he needs to go to the ED for CT scan to r/o appendicitis - At this point, negative Psoas/obturator sign but occasional TTP at McBurney's point along with negative Rovsing sing.   - Other etiology could be nephrolithiasis so will obtain UA

## 2014-10-02 NOTE — Progress Notes (Signed)
Mark Cook is a 59 y.o. male who presents today for R side pain.   R side Pain - Pt has had right lower side/abdominal pain for past week now, but worsening over the last two days.  Wife does have chlamydia which he has not been tested for at this time.  Denies fever, chills, sweats, anorexia, nausea, vomiting, diarrhea, dysuria, hematuria.  Has had previous renal stone.  Has not tried anything for this episode and denies radiating pain into groin or into penis.    Past Medical History  Diagnosis Date  . Diabetes mellitus without complication     History  Smoking status  . Former Smoker  Smokeless tobacco  . Never Used    Family History  Problem Relation Age of Onset  . Hypertension Mother   . Hypertension Sister   . Diabetes Sister     Current Outpatient Prescriptions on File Prior to Visit  Medication Sig Dispense Refill  . doxycycline (VIBRA-TABS) 100 MG tablet Take 1 tablet (100 mg total) by mouth 2 (two) times daily. 42 tablet 0  . glucose blood (ACCU-CHEK AVIVA) test strip Use as instructed 100 each 12  . Lancets (ACCU-CHEK SOFT TOUCH) lancets Use as instructed 100 each 12  . metFORMIN (GLUCOPHAGE) 500 MG tablet Take 1 tablet (500 mg total) by mouth 2 (two) times daily with a meal. 60 tablet 0  . oxyCODONE-acetaminophen (PERCOCET) 10-325 MG per tablet Take 1 tablet by mouth every 4 (four) hours as needed for pain. 30 tablet 0  . sildenafil (VIAGRA) 100 MG tablet Take 0.5-1 tablets (50-100 mg total) by mouth daily as needed for erectile dysfunction. 5 tablet 11  . tamsulosin (FLOMAX) 0.4 MG CAPS capsule Take 1 capsule (0.4 mg total) by mouth daily after supper. 10 capsule 0   No current facility-administered medications on file prior to visit.    ROS: Per HPI.  All other systems reviewed and are negative.   Physical Exam Filed Vitals:   10/02/14 1108  BP: 137/75  Pulse: 70  Temp: 98.3 F (36.8 C)    Physical Examination: General appearance - alert, well  appearing, and in no distress Chest - clear to auscultation, no wheezes, rales or rhonchi, symmetric air entry Abdomen - Soft, ND, NABS.  Slight TTP RLQ at McBurney's point.  No rebound at guarding.  Negative Rovsing's, Obturator, Psoas     Chemistry      Component Value Date/Time   NA 139 02/14/2014 1629   K 4.2 02/14/2014 1629   CL 103 02/14/2014 1629   CO2 25 02/14/2014 1629   BUN 17 02/14/2014 1629   CREATININE 1.18 02/14/2014 1629   CREATININE 1.10 02/09/2013 1333      Component Value Date/Time   CALCIUM 9.5 02/14/2014 1629   ALKPHOS 60 02/15/2013 1457   AST 25 02/15/2013 1457   ALT 26 02/15/2013 1457   BILITOT 0.7 02/15/2013 1457

## 2014-10-02 NOTE — Patient Instructions (Signed)
Chlamydia Chlamydia is an infection. It is spread through sexual contact. Chlamydia can be in different areas of the body. These areas include the urethra, throat, or rectum. It is important to treat chlamydia as soon as possible. It can damage other organs.  CAUSES  Chlamydia is caused by bacteria. It is a sexually transmitted disease. This means that it is passed from an infected partner during intimate contact. This contact could be with the genitals, mouth, or rectal area.  SIGNS AND SYMPTOMS  There may not be any symptoms. This is often the case early in the infection. If there are symptoms, they are usually mild and may only be noticeable in the morning. Symptoms you may notice include:   Burning with urination.  Pain or swelling in the testicles.  Watery mucus-like discharge from the penis.  Long-standing (chronic) pelvic pain after frequent infections.  Pain, swelling, or itching around the anus.  A sore throat.  Itching, burning, or redness in the eyes, or discharge from the eyes. DIAGNOSIS  To diagnose this infection, your health care provider will do a pelvic exam. A sample of urine or a swab from the rectum may be taken for testing.  TREATMENT  Chlamydia is treated with antibiotic medicines.  HOME CARE INSTRUCTIONS  Take your antibiotic medicine as directed by your health care provider. Finish the antibiotic even if you start to feel better. Incomplete treatment will put you at risk for not being able to have children (sterility).   Take medicines only as directed by your health care provider.   Rest.   Inform any sexual partners about your infection. Even if they are symptom free or have a negative culture or evaluation, they should be treated for the condition.   Do not have sex (intercourse) until treatment is completed and your health care provider says it is okay.   Keep all follow-up visits as directed by your health care provider.   Not all test results  are available during your visit. If your test results are not back during the visit, make an appointment with your health care provider to find out the results. Do not assume everything is normal if you have not heard from your health care provider or the medical facility. It is your responsibility to get your test results. SEEK MEDICAL CARE IF:  You develop new joint pain.  You have a fever. SEEK IMMEDIATE MEDICAL CARE IF:   Your pain increases.   You have abnormal discharge.   You have pain during intercourse. MAKE SURE YOU:   Understand these instructions.  Will watch your condition.  Will get help right away if you are not doing well or get worse. Document Released: 04/26/2005 Document Revised: 09/10/2013 Document Reviewed: 11/02/2012 ExitCare Patient Information 2015 [employer]. This information is not intended to replace advice given to you by your health care provider. Make sure you discuss any questions you have with your health care provider.  

## 2014-10-02 NOTE — Assessment & Plan Note (Signed)
Seen on CT from 02/14/14 - 7 mm nodule seen in the left lower lobe at that time - Consider repeat CT scan in next 6 months as low risk of lung CA

## 2014-10-03 ENCOUNTER — Encounter: Payer: Self-pay | Admitting: Family Medicine

## 2014-10-03 LAB — URINE CYTOLOGY ANCILLARY ONLY
CHLAMYDIA, DNA PROBE: NEGATIVE
NEISSERIA GONORRHEA: NEGATIVE

## 2014-10-03 LAB — HIV ANTIBODY (ROUTINE TESTING W REFLEX): HIV 1&2 Ab, 4th Generation: NONREACTIVE

## 2014-10-14 ENCOUNTER — Ambulatory Visit (INDEPENDENT_AMBULATORY_CARE_PROVIDER_SITE_OTHER): Payer: Medicare Other | Admitting: *Deleted

## 2014-10-14 DIAGNOSIS — R319 Hematuria, unspecified: Secondary | ICD-10-CM | POA: Diagnosis not present

## 2014-10-14 DIAGNOSIS — Z202 Contact with and (suspected) exposure to infections with a predominantly sexual mode of transmission: Secondary | ICD-10-CM | POA: Diagnosis not present

## 2014-10-14 MED ORDER — AZITHROMYCIN 500 MG PO TABS
1000.0000 mg | ORAL_TABLET | Freq: Once | ORAL | Status: AC
  Administered 2014-10-14: 1000 mg via ORAL

## 2014-10-14 NOTE — Progress Notes (Signed)
   Pt in nurse clinic stating his wife was positive for chlamydia last month.  He was seen in for an office visit on 10/02/14.  He stated that they had sex two days prior to his office visit but not treated.  Patient requested a copy of test results.  Pt signed release of information; copies given.  Verbal order given by Dr. Gwendolyn GrantWalden to treat patient for contact to Chlamydia with Azithromycin 1 gm PO x 1.  Clovis PuMartin, Shametra Cumberland L, RN

## 2014-10-29 ENCOUNTER — Ambulatory Visit: Payer: Medicare Other | Admitting: Family Medicine

## 2014-12-12 ENCOUNTER — Other Ambulatory Visit: Payer: Self-pay | Admitting: Family Medicine

## 2015-04-23 ENCOUNTER — Other Ambulatory Visit: Payer: Self-pay | Admitting: Family Medicine

## 2015-09-10 ENCOUNTER — Other Ambulatory Visit: Payer: Self-pay | Admitting: Family Medicine

## 2015-09-11 NOTE — Telephone Encounter (Signed)
It has been [facility] yr [facility].  1 refill of Metformin sent, but patient needs f/u appt with any available provider before further refills given.  Erasmo DownerAngela M Ahmod Gillespie, MD, MPH PGY-2,  [facility] 09/11/2015 6:51 PM

## 2015-09-19 NOTE — Telephone Encounter (Signed)
Tried to reach patient by phone but voicemail was full. Chevon Fomby,CMA

## 2015-10-16 ENCOUNTER — Ambulatory Visit (INDEPENDENT_AMBULATORY_CARE_PROVIDER_SITE_OTHER): Payer: Commercial Managed Care - HMO | Admitting: Family Medicine

## 2015-10-16 ENCOUNTER — Encounter: Payer: Self-pay | Admitting: Family Medicine

## 2015-10-16 VITALS — BP 132/83 | HR 61 | Temp 97.7°F | Wt 223.0 lb

## 2015-10-16 DIAGNOSIS — T148 Other injury of unspecified body region: Secondary | ICD-10-CM | POA: Diagnosis not present

## 2015-10-16 DIAGNOSIS — W57XXXA Bitten or stung by nonvenomous insect and other nonvenomous arthropods, initial encounter: Secondary | ICD-10-CM | POA: Diagnosis not present

## 2015-10-16 MED ORDER — CETIRIZINE HCL 10 MG PO TABS: 10.0000 mg | ORAL_TABLET | Freq: Every day | ORAL | Status: DC

## 2015-10-16 NOTE — Progress Notes (Signed)
   HPI  CC: RASH Lump on right eye brow. Started 2 days ago. Woke up and noticed this. "might have" seen something at the time of first noticing this lump, but unsure if there was any insert at that time. Did not feel any bite--but "I thought I felt something move when I first noticed it". Mostly sore and tender to the touch. Some tenderness and swelling in right cheek.  Had rash for 2 days. Location: right brow Medications tried: no Similar rash in past: no New medications or antibiotics: no Tick, Insect or new pet exposure: Possible insect Recent travel: no New detergent or soap: no Immunocompromised: no  Symptoms Itching: minimal Pain over rash: yes pain and soreness Feeling ill all over: no Fever: no Mouth sores: no Tongue swelling: no Trouble breathing: no Joint swelling or pain: no  Review of Symptoms - see HPI PMH - Smoking status noted.    Objective: BP 132/83 mmHg  Pulse 61  Temp(Src) 97.7 F (36.5 C) (Oral)  Wt 223 lb (101.152 kg) Gen: NAD, alert, cooperative HEENT: [name], EOMI, PERRL, MMM, right brow with 5 cm x 2 cm erythematous lesion with underlying fluctuance, and scaly appearance. No vesicles or pustules present. No folliculitis noted. LAD noted with tenderness at the right preauricular and parotid nodes. Neuro: Alert and oriented, Speech clear, No gross deficits.   Assessment and plan:  Insect bite Patient presenting w/ a skin lesion of unknown etiology. History and appearance most c/w an insect (possibly arachnid) bite. No evidence of folliculitis or cellulitis at this time. However, tenderness and swelling of the draining lymphatics is suggestive of cell turnover -- making an infection (cellulitis) more likely if insect bite is ruled out. No evidence of abscess or ulceration. Will treat for spider bite. - NSAIDs for pain/swelling - Ice - antihistamine for pruritis and swelling.   (attempted to take photo but had technical problems)   Meds ordered  this encounter  Medications  . cetirizine (ZYRTEC) 10 MG tablet    Sig: Take 1 tablet (10 mg total) by mouth daily.    Dispense:  30 tablet    Refill:  0     Mark DeltonIan D Anu Stagner, MD,MS,  PGY2 10/16/2015 9:58 PM

## 2015-10-16 NOTE — Patient Instructions (Signed)
Spider Bite °Spider bites are not common. When spider bites do happen, most do not cause serious health problems. There are only a few types of spider bites that can cause serious health problems. °CAUSES °A spider bite usually happens when a person accidentally makes contact with a spider in a way that traps the spider against the person's skin. °SYMPTOMS °Symptoms may vary depending on the type of spider. Some spider bites may cause symptoms within 1 hour after the bite. For other spider bites, it may take 1-2 days for symptoms to develop. Common symptoms include: °· Redness and swelling in the area of the bite. °· Discomfort or pain in the area of the bite. °A few types of spiders, such as the black widow spider or the brown recluse spider, can inject poison (venom) into a bite wound. This venom causes more serious symptoms. Symptoms of a venomous spider bite vary, and may include: °· Muscle cramps. °· Nausea, vomiting, or abdominal pain. °· Fever. °· A skin sore (lesion) that spreads. This can break into an open wound (skin ulcer). °· Light-headedness or dizziness. °DIAGNOSIS °This condition may be diagnosed based on your symptoms and a physical exam. Your health care provider will ask about the history of your injury and any details you may have about the spider. This may help to determine what type of spider it was that bit you. °TREATMENT °Many spider bites do not require treatment. If needed, treatment may include: °· Icing and keeping the bite area raised (elevated). °· Over-the-counter or prescription medicines to help control symptoms. °· A tetanus shot. °· Antibiotic medicines. °HOME CARE INSTRUCTIONS °Medicines °· Take or apply over-the-counter and prescription medicines only as told by your health care provider. °· If you were prescribed an antibiotic medicine, take or apply it as told by your health care provider. Do not stop using the antibiotic even if your condition improves. °General  Instructions °· Do not scratch the bite area. °· Keep the bite area clean and dry. Wash the bite area daily with soap and water as told by your health care provider. °· If directed, apply ice to the bite area. °¨ Put ice in a plastic bag. °¨ Place a towel between your skin and the bag. °¨ Leave the ice on for 20 minutes, 2-3 times per day. °· Elevate the affected area above the level of your heart while you are sitting or lying down, if possible. °· Keep all follow-up visits as told by your health care provider. This is important. °SEEK MEDICAL CARE IF: °· Your bite does not get better after 3 days of treatment. °· Your bite turns black or purple. °· You have increased redness, swelling, or pain at the site of the bite. °SEEK IMMEDIATE MEDICAL CARE IF: °· You develop shortness of breath or chest pain. °· You have fluid, blood, or pus coming from the bite area. °· You have muscle cramps or painful muscle spasms. °· You develop abdominal pain, nausea, or vomiting. °· You feel unusually tired (fatigued) or sleepy. °  °This information is not intended to replace advice given to you by your health care provider. Make sure you discuss any questions you have with your health care provider. °  °Document Released: 06/03/2004 Document Revised: 01/15/2015 Document Reviewed: 09/11/2014 °Elsevier Interactive Patient Education ©2016 [employer]. ° °

## 2015-10-16 NOTE — Assessment & Plan Note (Signed)
Patient presenting w/ a skin lesion of unknown etiology. History and appearance most c/w an insect (possibly arachnid) bite. No evidence of folliculitis or cellulitis at this time. However, tenderness and swelling of the draining lymphatics is suggestive of cell turnover -- making an infection (cellulitis) more likely if insect bite is ruled out. No evidence of abscess or ulceration. Will treat for spider bite. - NSAIDs for pain/swelling - Ice - antihistamine for pruritis and swelling.   (attempted to take photo but had technical problems)

## 2015-10-17 ENCOUNTER — Encounter: Payer: Self-pay | Admitting: Family Medicine

## 2015-10-17 ENCOUNTER — Ambulatory Visit (INDEPENDENT_AMBULATORY_CARE_PROVIDER_SITE_OTHER): Payer: Commercial Managed Care - HMO | Admitting: Internal Medicine

## 2015-10-17 VITALS — BP 127/85 | HR 67 | Temp 97.7°F | Wt 225.0 lb

## 2015-10-17 DIAGNOSIS — W57XXXA Bitten or stung by nonvenomous insect and other nonvenomous arthropods, initial encounter: Secondary | ICD-10-CM

## 2015-10-17 DIAGNOSIS — T148 Other injury of unspecified body region: Secondary | ICD-10-CM | POA: Diagnosis not present

## 2015-10-17 MED ORDER — CEPHALEXIN 500 MG PO CAPS: 500.0000 mg | ORAL_CAPSULE | Freq: Four times a day (QID) | ORAL | Status: DC

## 2015-10-17 NOTE — Patient Instructions (Signed)
It was nice meeting you today Mr. Stoffel.   Please begin taking the antibiotic (Keflex) 500mg  (1 tablet) every six hours for the next 10 days. You can also take Benadryl every 4-6 hours as needed for itching. You can continue to take ibuprofen for pain, and to ice the area if this relieves your pain.   If you begin having fever or vision changes, or if your symptoms do not improve in a few days, please call the office to schedule another appointment.   If you have any questions or concerns, feel free to call the office.   Be well,  Dr. Natale MilchLancaster

## 2015-10-17 NOTE — Progress Notes (Signed)
   Subjective:    Patient ID: Mark Cook, male    DOB: 12/15/1955, 60 y.o.   MRN: [id number]  HPI  Patient presents for same day appointment for insect bite.   Patient woke up 3 days ago and noticed what appeared like an insect bite above his R eyebrow. [facility], and was instructed to take Benadryl and ibuprofen and ice the area. The bite did not appear infected at this time. When the patient awoke this AM, the swelling had increased to the point where he cannot open his eye, there was increased drainage from the puncture sites, and he had developed swelling along his R temporal region extending to his cheek. He also reports increased pain and itching. He iced the area and took ibuprofen yesterday and this AM, but did not take any Benadryl. Patient denies vision changes, fevers, chills, nausea, vomiting.   Patient is former smoker.  Review of Systems See HPI.     Objective:   Physical Exam  Constitutional: He is oriented to person, place, and time. He appears well-developed and well-nourished. No distress.  HENT:  Two small puncture wounds above R eyebrow with crusted discharge present. Severe edema of R eyelid extending above R eyebrow and to R temporal region and R cheek. Slightly tender to palpation.   Eyes: Conjunctivae and EOM are normal. Pupils are equal, round, and reactive to light. Right eye exhibits no discharge. Left eye exhibits no discharge.  Neurological: He is alert and oriented to person, place, and time.  Psychiatric: He has a normal mood and affect. His behavior is normal.  Vitals reviewed.     Assessment & Plan:  Insect bite Worsening since appointment yesterday. Now with increased drainage and facial edema, raising concern for infection.  - Keflex 500mg  QID x10d - Benadryl q4-6 hrs - Can continue ibuprofen and ice PRN pain - Strict return precautions for fever or no improvement in a few days   Tarri AbernethyAbigail J Senya Hinzman, MD PGY-1 [facility] Pager [phone]

## 2015-10-17 NOTE — Assessment & Plan Note (Signed)
Worsening since appointment yesterday. Now with increased drainage and facial edema, raising concern for infection.  - Keflex 500mg  QID x10d - Benadryl q4-6 hrs - Can continue ibuprofen and ice PRN pain - Strict return precautions for fever or no improvement in a few days

## 2015-10-23 ENCOUNTER — Other Ambulatory Visit: Payer: Self-pay | Admitting: *Deleted

## 2015-10-23 DIAGNOSIS — E119 Type 2 diabetes mellitus without complications: Secondary | ICD-10-CM

## 2015-10-24 MED ORDER — METFORMIN HCL 500 MG PO TABS: 500.0000 mg | ORAL_TABLET | Freq: Two times a day (BID) | ORAL | Status: DC

## 2015-10-24 NOTE — Telephone Encounter (Signed)
30 day supply given. Patient needs office visit to discuss diabetes management.  Katina Degreealeb M. Jimmey RalphParker, MD [facility] Resident PGY-2 10/24/2015 8:46 AM

## 2015-10-30 NOTE — Telephone Encounter (Signed)
Patient informed, appointment scheduled for 7/6 with PCP.

## 2015-11-13 ENCOUNTER — Ambulatory Visit: Payer: Commercial Managed Care - HMO | Admitting: Family Medicine

## 2015-12-03 ENCOUNTER — Ambulatory Visit (INDEPENDENT_AMBULATORY_CARE_PROVIDER_SITE_OTHER): Payer: Commercial Managed Care - HMO | Admitting: Student

## 2015-12-03 ENCOUNTER — Other Ambulatory Visit (HOSPITAL_COMMUNITY)
Admission: RE | Admit: 2015-12-03 | Discharge: 2015-12-03 | Disposition: A | Discharge status: Home / Self Care | Payer: Commercial Managed Care - HMO | Source: Ambulatory Visit | Attending: Family Medicine | Admitting: Family Medicine

## 2015-12-03 ENCOUNTER — Ambulatory Visit: Payer: Commercial Managed Care - HMO | Admitting: Student

## 2015-12-03 ENCOUNTER — Other Ambulatory Visit: Payer: Self-pay | Admitting: Family Medicine

## 2015-12-03 ENCOUNTER — Encounter: Payer: Self-pay | Admitting: Student

## 2015-12-03 ENCOUNTER — Other Ambulatory Visit (HOSPITAL_COMMUNITY)
Admission: RE | Admit: 2015-12-03 | Payer: Commercial Managed Care - HMO | Source: Ambulatory Visit | Admitting: Family Medicine

## 2015-12-03 VITALS — BP 165/80 | HR 102 | Temp 97.6°F | Wt 223.8 lb

## 2015-12-03 DIAGNOSIS — E119 Type 2 diabetes mellitus without complications: Secondary | ICD-10-CM | POA: Diagnosis not present

## 2015-12-03 DIAGNOSIS — Z7251 High risk heterosexual behavior: Secondary | ICD-10-CM | POA: Diagnosis not present

## 2015-12-03 DIAGNOSIS — R300 Dysuria: Secondary | ICD-10-CM | POA: Insufficient documentation

## 2015-12-03 DIAGNOSIS — Z113 Encounter for screening for infections with a predominantly sexual mode of transmission: Secondary | ICD-10-CM | POA: Diagnosis not present

## 2015-12-03 DIAGNOSIS — M545 Low back pain, unspecified: Secondary | ICD-10-CM | POA: Insufficient documentation

## 2015-12-03 DIAGNOSIS — R030 Elevated blood-pressure reading, without diagnosis of hypertension: Secondary | ICD-10-CM

## 2015-12-03 DIAGNOSIS — IMO0001 Reserved for inherently not codable concepts without codable children: Secondary | ICD-10-CM

## 2015-12-03 LAB — POCT URINALYSIS DIPSTICK
Bilirubin, UA: NEGATIVE
Blood, UA: NEGATIVE
Glucose, UA: NEGATIVE
KETONES UA: NEGATIVE
LEUKOCYTES UA: NEGATIVE
NITRITE UA: NEGATIVE
PH UA: 5.5
PROTEIN UA: NEGATIVE
Spec Grav, UA: 1.02
Urobilinogen, UA: 0.2

## 2015-12-03 MED ORDER — METFORMIN HCL 500 MG PO TABS: 500.0000 mg | ORAL_TABLET | Freq: Two times a day (BID) | ORAL | 0 refills | Status: DC

## 2015-12-03 NOTE — Assessment & Plan Note (Signed)
Patient's symptoms suggestive for UTI and/or STI. He endorses dysuria & penile discharge.He has risk factors for STI given history of unprotected sex. Doubt pyelonephritis without fever or chills. Urinalysis is negative today. Hard to assess CVA tenderness as is tender to palpation over his left lower back. He could also have paraspinal muscle spasm. -Urine culture -Urine for gonorrhea/chlamydia/trichomoniasis -Blood for HIV/RPR -Discussed return precautions

## 2015-12-03 NOTE — Patient Instructions (Signed)
It was great seeing you today! We have addressed the following issues today  1. Back pain/Belly pain: I have ordered some tests. I will call you if the results are abnormal to discuss about the management. Otherwise, I will send you a letter in mail.  2. Diabetes: I refilled the metformin today. I strongly recommend you come back and see your primary care doctor for blood work.     If we did any lab work today, and the results require attention, either me or my nurse will get in touch with you. If everything is normal, you will get a letter in mail. If you don't hear from [place] in two weeks, please give [place] a call. Otherwise, I look forward to talking with you again at our next visit. If you have any questions or concerns before then, please call the clinic at [phone].  Please bring all your medications to every doctors visit   Sign up for My Chart to have easy access to your labs results, and communication with your Primary care physician.    Please check-out at the front desk before leaving the clinic.   Take Care,

## 2015-12-03 NOTE — Assessment & Plan Note (Addendum)
Toward the end of the encounter, patient asked if I can give him a refill on his metformin. He reports requesting a refill on this medication recently but didn't hear back from his doctor.  -Refilled his metformin

## 2015-12-03 NOTE — Assessment & Plan Note (Signed)
Blood pressure was elevated to 183/168 on arrival which has improved on repeat to 165/80. He is not on ACE inhibitor's. His A1c was 6.6 in 2015, from 9.6 prior to that. I have advised him to come back and follow-up on his diabetes and elevated blood pressure as soon as he can.

## 2015-12-03 NOTE — Progress Notes (Signed)
   Subjective:    Patient ID: Mark Cook, male    DOB: [birth year], 60 y.o.   MRN: [id number]  CC: Left back pain  HPI #left back pain: for the last 4 days. Pain radiates to his groin when he urinates. Reports dysuria with urination. Says it feels like gonorrhea when he had it 30 years agio. Denies blood in urine or frequent urination. Denies fever, chills. Denies lesion on his penis. Denies swelling in his scrotum.  Reports penile discharge. Looks "lighter than whitish lotion".  Sexually active: three weeks ago. Denies using condoms. One sexual partner in the last 6 months. Two sexual partners in the last twelve months.   Review of Systems Per HPI Objective:   Vitals:   12/03/15 1519 12/03/15 1627  BP: (!) 183/168 (!) 165/80  Pulse: (!) 102   Temp: 97.6 F (36.4 C)   TempSrc: Oral   Weight: 223 lb 12.8 oz (101.5 kg)     GEN: appears well, no apparent distress. CVS: RRR, normal s1 and s2 RESP: no increased work of breathing GI: soft, non-tender,non-distended, +BS GU: Uncircumcised. No apparent lesion or penile discharge. No scrotal swelling or tenderness. Some suprapubic discomfort with palpation. He is tender to palpation over his left lower back so unable to assess CVA tenderness MSK: Tenderness to palpation over his left lower back NEURO: alert and oriented appropriately, no gross defecits  PSYCH: appropriate mood and affect   Assessment & Plan:  Left low back pain Patient's symptoms suggestive for UTI and/or STI. He endorses dysuria & penile discharge.He has risk factors for STI given history of unprotected sex. Doubt pyelonephritis without fever or chills. Urinalysis is negative today. Hard to assess CVA tenderness as is tender to palpation over his left lower back. He could also have paraspinal muscle spasm. -Urine culture -Urine for gonorrhea/chlamydia/trichomoniasis -Blood for HIV/RPR -Discussed return precautions  Type II or unspecified type diabetes mellitus without  mention of complication, not stated as uncontrolled Toward the end of the encounter, patient asked if I can give him a refill on his metformin. He reports requesting a refill on this medication recently but didn't hear back from his doctor.  -Refilled his metformin   Elevated blood pressure Blood pressure was elevated to 183/168 on arrival which has improved on repeat to 165/80. He is not on ACE inhibitor's. His A1c was 6.6 in 2015, from 9.6 prior to that. I have advised him to come back and follow-up on his diabetes and elevated blood pressure as soon as he can.

## 2015-12-03 NOTE — Telephone Encounter (Signed)
Patient requesting 3 month supply

## 2015-12-04 LAB — URINE CYTOLOGY ANCILLARY ONLY
Chlamydia: NEGATIVE
Neisseria Gonorrhea: NEGATIVE
Trichomonas: NEGATIVE

## 2015-12-04 LAB — RPR

## 2015-12-04 LAB — URINE CULTURE: Organism ID, Bacteria: NO GROWTH

## 2015-12-04 LAB — HIV ANTIBODY (ROUTINE TESTING W REFLEX): HIV: NONREACTIVE

## 2015-12-05 ENCOUNTER — Encounter: Payer: Self-pay | Admitting: Student

## 2016-02-26 ENCOUNTER — Other Ambulatory Visit: Payer: Self-pay | Admitting: Student

## 2016-02-26 DIAGNOSIS — E119 Type 2 diabetes mellitus without complications: Secondary | ICD-10-CM

## 2016-05-13 ENCOUNTER — Other Ambulatory Visit: Payer: Self-pay | Admitting: Family Medicine

## 2016-05-13 DIAGNOSIS — E119 Type 2 diabetes mellitus without complications: Secondary | ICD-10-CM

## 2016-08-04 ENCOUNTER — Other Ambulatory Visit: Payer: Self-pay | Admitting: Family Medicine

## 2016-08-04 DIAGNOSIS — E119 Type 2 diabetes mellitus without complications: Secondary | ICD-10-CM

## 2016-08-09 ENCOUNTER — Ambulatory Visit (INDEPENDENT_AMBULATORY_CARE_PROVIDER_SITE_OTHER): Payer: Medicare HMO | Admitting: Internal Medicine

## 2016-08-09 ENCOUNTER — Encounter: Payer: Self-pay | Admitting: Internal Medicine

## 2016-08-09 VITALS — BP 110/70 | HR 74 | Temp 98.2°F | Wt 248.0 lb

## 2016-08-09 DIAGNOSIS — I96 Gangrene, not elsewhere classified: Secondary | ICD-10-CM

## 2016-08-09 HISTORY — DX: Gangrene, not elsewhere classified: I96

## 2016-08-09 NOTE — Assessment & Plan Note (Signed)
Has been present for a couple of months. No signs of active infection. -Urgent referral to ortho for possible amputation

## 2016-08-09 NOTE — Progress Notes (Signed)
   [facility] Phone: [phone]  Subjective:  Mark Cook is a 61 year old male presenting as a walk-in to clinic for a black toe. The 2nd toe of his left foot has been black for the last couple of months. He states that the toe initially turned black after he wore steel-toed shoes that were too tight. He states that he is completely numb on the left side of his body due to a spinal cord injury, so the toe is not painful at all. He has been slowly cutting it off with a razor blade. A couple days ago, and split open but didn't bleed. He denies any fevers or chills. He denies any drainage or surrounding erythema. He states he has felt completely normal recently.  ROS: See HPI for pertinent positives and negatives  Past Medical History- type 2 diabetes  Family history reviewed for today's visit. No changes.  Social history- patient is a former smoker  Objective: BP 110/70   Pulse 74   Temp 98.2 F (36.8 C) (Oral)   Wt 248 lb (112.5 kg)   SpO2 93%   BMI 30.19 kg/m  Gen: NAD, alert, cooperative with exam Msk:    No drainage, no surrounding erythema. CV: 2+ DP and PT pulses in the LLE  Assessment/Plan: Necrosis of Toe: Has been present for a couple of months. No signs of active infection. -Urgent referral to ortho for possible amputation   Willadean Carol, MD PGY-2

## 2016-08-09 NOTE — Patient Instructions (Signed)
It was so nice to meet you!  I have sent in a referral to Orthopedic Surgery. We will call to try to get you in for an appointment in the next 1-2 days. We will call you for this appointment time.  -Dr. Nancy Marus

## 2016-10-24 ENCOUNTER — Other Ambulatory Visit: Payer: Self-pay | Admitting: Family Medicine

## 2016-10-24 DIAGNOSIS — E119 Type 2 diabetes mellitus without complications: Secondary | ICD-10-CM

## 2016-11-02 ENCOUNTER — Other Ambulatory Visit (INDEPENDENT_AMBULATORY_CARE_PROVIDER_SITE_OTHER): Payer: Self-pay | Admitting: Orthopedic Surgery

## 2016-11-02 ENCOUNTER — Ambulatory Visit (INDEPENDENT_AMBULATORY_CARE_PROVIDER_SITE_OTHER): Payer: Medicare HMO | Admitting: Orthopedic Surgery

## 2016-11-02 ENCOUNTER — Encounter (INDEPENDENT_AMBULATORY_CARE_PROVIDER_SITE_OTHER): Payer: Self-pay | Admitting: Orthopedic Surgery

## 2016-11-02 ENCOUNTER — Ambulatory Visit (INDEPENDENT_AMBULATORY_CARE_PROVIDER_SITE_OTHER): Payer: Medicare HMO | Admitting: Family Medicine

## 2016-11-02 VITALS — BP 148/80 | HR 86 | Temp 98.0°F | Wt 246.0 lb

## 2016-11-02 VITALS — Ht 76.0 in | Wt 246.0 lb

## 2016-11-02 DIAGNOSIS — M86272 Subacute osteomyelitis, left ankle and foot: Secondary | ICD-10-CM | POA: Diagnosis not present

## 2016-11-02 DIAGNOSIS — E1142 Type 2 diabetes mellitus with diabetic polyneuropathy: Secondary | ICD-10-CM

## 2016-11-02 DIAGNOSIS — I96 Gangrene, not elsewhere classified: Secondary | ICD-10-CM | POA: Diagnosis not present

## 2016-11-02 DIAGNOSIS — M25572 Pain in left ankle and joints of left foot: Secondary | ICD-10-CM | POA: Diagnosis not present

## 2016-11-02 MED ORDER — DOXYCYCLINE HYCLATE 100 MG PO TABS: 100.0000 mg | ORAL_TABLET | Freq: Two times a day (BID) | ORAL | 0 refills | Status: DC

## 2016-11-02 NOTE — Progress Notes (Signed)
Office Visit Note   Patient: Mark Cook           Date of Birth: 05/04/1956           MRN: [id number] Visit Date: 11/02/2016              Requested by: Erasmo DownerBacigalupo, Angela M, MD [address] [place] [id number] PCP: Erasmo DownerBacigalupo, Angela M, MD  Chief Complaint  Patient presents with  . Left Foot - Wound Check      HPI: Patient is a 61 year old gentleman with type 2 diabetes who states he's had a 2 month history of swelling ulceration and drainage from the left foot second toe. Patient was initially seen with family practice was [facility] to [facility] and was referred back to family practice and then referred here for evaluation and treatment of the second toe ulcer.  Assessment & Plan: Visit Diagnoses:  1. Subacute osteomyelitis of left foot (HCC)   2. Diabetic polyneuropathy associated with type 2 diabetes mellitus (HCC)     Plan: Patient has osteomyelitis with draining abscess of the left foot second toe. We'll plan for left foot second Dory amputation. Risks and benefits were discussed including persistent infection neurovascular injury nonhealing the wound need for additional surgery. Patient states he understands wishes to proceed at this time discussed the importance of being nonweightbearing for 2 weeks postoperatively. 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  Follow-Up Instructions: Return in about 1 week (around 11/09/2016).   Ortho Exam  Patient is alert, oriented, no adenopathy, well-dressed, normal affect, normal respiratory effort. Examination patient has an antalgic gait he is using sandals he has a strong dorsalis pedis pulse he has sausage digit swelling of the left foot second toe. After informed consent a 10 blade knife was used to debride the skin and soft tissue down to nonviable necrotic tissue. The ulcer is 7 mm in diameter and 10 mm deep. This probes all the way down to bone. Review of the radiograph shows destructive changes of the tuft of the left foot second  toe. There is no ascending cellulitis.  Imaging: No results found.  Labs: Lab Results  Component Value Date   HGBA1C 6.6 09/18/2013   HGBA1C 9.6 02/15/2013   LABORGA NO GROWTH 12/03/2015    Orders:  No orders of the defined types were placed in this encounter.  No orders of the defined types were placed in this encounter.    Procedures: No procedures performed  Clinical Data: No additional findings.  ROS:  All other systems negative, except as noted in the HPI. Review of Systems  Objective: Vital Signs: Ht 6\' 4"  (1.93 m)   Wt 246 lb (111.6 kg)   BMI 29.94 kg/m   Specialty Comments:  No specialty comments available.  PMFS History: Patient Active Problem List   Diagnosis Date Noted  . Subacute osteomyelitis of left foot (HCC) 11/02/2016  . Diabetic polyneuropathy associated with type 2 diabetes mellitus (HCC) 11/02/2016  . Necrosis of toe (HCC) 08/09/2016  . Unprotected sexual intercourse 12/03/2015  . Dysuria 12/03/2015  . Left low back pain 12/03/2015  . Elevated blood pressure 12/03/2015  . Insect bite 10/16/2015  . Lower abdominal pain 10/02/2014  . Nodule of left lung 10/02/2014  . Hematuria 02/14/2014  . Erectile dysfunction 09/03/2013  . Type II or unspecified type diabetes mellitus without mention of complication, not stated as uncontrolled 02/15/2013  . Rib pain on right side 02/15/2013   Past Medical History:  Diagnosis Date  .  Diabetes mellitus without complication (HCC)     Family History  Problem Relation Age of Onset  . Hypertension Mother   . Hypertension Sister   . Diabetes Sister     Past Surgical History:  Procedure Laterality Date  . NECK SURGERY     Social History   Occupational History  . Not on file.   Social History Main Topics  . Smoking status: Former Games developer  . Smokeless tobacco: Never Used  . Alcohol use 0.0 oz/week     Comment: 3-5 drinks per week  . Drug use: No  . Sexual activity: Yes

## 2016-11-02 NOTE — Assessment & Plan Note (Signed)
Appears infected currently with large lateral abscess Concern for osteo given abscess and necrosis To be seen by orthopedics today Doxycycline 10 day course since the pharmacy, but will leave ultimate management decision to orthopedics Concerned that he likely will need an amputation Discussed proper diabetic foot care

## 2016-11-02 NOTE — Progress Notes (Signed)
   Subjective:   Mark Cook is a 61 y.o. male with a history of T2 DM, elevated blood pressure here for same day appointment for  Chief Complaint  Patient presents with  . Toe Pain    Patient was previously seen for necrosis of L 2nd toe for a few months in 08/2016.  He was referred to Ortho urgently at that time, but no showed for his appt.  He states that he is not on the left side of his body due to previous spinal cord injury, so the toe is not painful at all. He had previously been slowly cutting it off with a razor blade, but he did stop doing this when he noticed swelling last week. He feels like there is pus underneath the skin because it is square she to the touch. He denies any fevers or chills, drainage, erythema.  Review of Systems:  Per HPI.   Social History: former smoker  Objective:  BP (!) 148/80   Pulse 86   Temp 98 F (36.7 C) (Oral)   Wt 246 lb (111.6 kg)   BMI 29.94 kg/m   Gen:  61 y.o. male in NAD HEENT: NCAT, MMM, anicteric sclerae CV: RRR, no MRG Resp: Non-labored, CTAB, no wheezes noted Ext: WWP, no edema MSK: L 2nd toe necrotic and lateral fluctuance, no TTP Neuro: Alert and oriented, speech normal, no sensation to light touch in L foot               Chemistry      Component Value Date/Time   NA 141 10/02/2014 1132   K 4.7 10/02/2014 1132   CL 103 10/02/2014 1132   CO2 27 10/02/2014 1132   BUN 16 10/02/2014 1132   CREATININE 0.98 10/02/2014 1132      Component Value Date/Time   CALCIUM 9.9 10/02/2014 1132   ALKPHOS 60 02/15/2013 1457   AST 25 02/15/2013 1457   ALT 26 02/15/2013 1457   BILITOT 0.7 02/15/2013 1457      Lab Results  Component Value Date   WBC 4.5 10/02/2014   HGB 14.9 10/02/2014   HCT 43.5 10/02/2014   MCV 86.0 10/02/2014   PLT 178 10/02/2014   No results found for: TSH Lab Results  Component Value Date   HGBA1C 6.6 09/18/2013   Assessment & Plan:     Mark Cook is a 61 y.o. male here for   Necrosis of  toe (HCC) Appears infected currently with large lateral abscess Concern for osteo given abscess and necrosis To be seen by orthopedics today Doxycycline 10 day course since the pharmacy, but will leave ultimate management decision to orthopedics Concerned that he likely will need an amputation Discussed proper diabetic foot care   Karna Abed, Marzella SchleinAngela M, MD MPH PGY-3,  [facility] 11/02/2016  9:41 AM

## 2016-11-02 NOTE — Patient Instructions (Signed)
Go straight to the Orthopedic office.  I have sent an antibiotic to the pharmacy, but they may have a different plan.  Discuss with orthopedic.  Take care, Dr. BLeonard Schwartz

## 2016-11-04 ENCOUNTER — Encounter (HOSPITAL_COMMUNITY): Payer: Self-pay | Admitting: *Deleted

## 2016-11-04 MED ORDER — CEFAZOLIN SODIUM-DEXTROSE 2-4 GM/100ML-% IV SOLN
2.0000 g | INTRAVENOUS | Status: AC
  Administered 2016-11-05: 2 g via INTRAVENOUS
  Filled 2016-11-04: qty 100

## 2016-11-04 NOTE — Progress Notes (Signed)
Mr Mark Cook is a Type II diabetic, he has difficulty checking CBGs due to weakness in right hand and no feeling in left hand.  (patient said he is going to  See if he can purchase one today that would be easier). I instrucwted patient to not take pills for diabetes ion am. If patient gets a new machine: I instructed patient to check CBG after awaking and every 2 hours until arrival  to the hospital.  I Instructed patient if CBG is less than 70 to take 4 Glucose Tablets or 1 tube of Glucose Gel or 1/2 cup of a clear juice. Recheck CBG in 15 minutes then call pre- op desk at [phone] for further instructions.

## 2016-11-05 ENCOUNTER — Encounter (HOSPITAL_COMMUNITY)
Admission: RE | Disposition: A | Discharge status: Home / Self Care | Payer: Self-pay | Source: Ambulatory Visit | Attending: Orthopedic Surgery

## 2016-11-05 ENCOUNTER — Encounter (HOSPITAL_COMMUNITY): Payer: Self-pay | Admitting: *Deleted

## 2016-11-05 ENCOUNTER — Ambulatory Visit (HOSPITAL_COMMUNITY): Payer: Medicare HMO | Admitting: Anesthesiology

## 2016-11-05 ENCOUNTER — Ambulatory Visit (HOSPITAL_COMMUNITY)
Admission: RE | Admit: 2016-11-05 | Discharge: 2016-11-05 | Disposition: A | Discharge status: Home / Self Care | Payer: Medicare HMO | Source: Ambulatory Visit | Attending: Orthopedic Surgery | Admitting: Orthopedic Surgery

## 2016-11-05 DIAGNOSIS — Z79899 Other long term (current) drug therapy: Secondary | ICD-10-CM | POA: Insufficient documentation

## 2016-11-05 DIAGNOSIS — Z7984 Long term (current) use of oral hypoglycemic drugs: Secondary | ICD-10-CM | POA: Insufficient documentation

## 2016-11-05 DIAGNOSIS — M869 Osteomyelitis, unspecified: Secondary | ICD-10-CM | POA: Diagnosis not present

## 2016-11-05 DIAGNOSIS — E1169 Type 2 diabetes mellitus with other specified complication: Secondary | ICD-10-CM | POA: Diagnosis not present

## 2016-11-05 DIAGNOSIS — R201 Hypoesthesia of skin: Secondary | ICD-10-CM | POA: Diagnosis not present

## 2016-11-05 DIAGNOSIS — E1140 Type 2 diabetes mellitus with diabetic neuropathy, unspecified: Secondary | ICD-10-CM | POA: Diagnosis not present

## 2016-11-05 DIAGNOSIS — M86272 Subacute osteomyelitis, left ankle and foot: Secondary | ICD-10-CM

## 2016-11-05 DIAGNOSIS — E11621 Type 2 diabetes mellitus with foot ulcer: Secondary | ICD-10-CM | POA: Diagnosis not present

## 2016-11-05 DIAGNOSIS — L97529 Non-pressure chronic ulcer of other part of left foot with unspecified severity: Secondary | ICD-10-CM | POA: Insufficient documentation

## 2016-11-05 DIAGNOSIS — M1990 Unspecified osteoarthritis, unspecified site: Secondary | ICD-10-CM | POA: Insufficient documentation

## 2016-11-05 DIAGNOSIS — Z87891 Personal history of nicotine dependence: Secondary | ICD-10-CM | POA: Insufficient documentation

## 2016-11-05 DIAGNOSIS — Z791 Long term (current) use of non-steroidal anti-inflammatories (NSAID): Secondary | ICD-10-CM | POA: Diagnosis not present

## 2016-11-05 DIAGNOSIS — N529 Male erectile dysfunction, unspecified: Secondary | ICD-10-CM | POA: Diagnosis not present

## 2016-11-05 HISTORY — DX: Unspecified osteoarthritis, unspecified site: M19.90

## 2016-11-05 HISTORY — PX: AMPUTATION: SHX166

## 2016-11-05 HISTORY — DX: Personal history of urinary calculi: Z87.442

## 2016-11-05 HISTORY — DX: Hypoesthesia of skin: R20.1

## 2016-11-05 HISTORY — DX: Pneumonia, unspecified organism: J18.9

## 2016-11-05 HISTORY — DX: Accidental discharge from unspecified firearms or gun, initial encounter: W34.00XA

## 2016-11-05 LAB — CBC
HEMATOCRIT: 40.9 % (ref 39.0–52.0)
HEMOGLOBIN: 14.3 g/dL (ref 13.0–17.0)
MCH: 29.8 pg (ref 26.0–34.0)
MCHC: 35 g/dL (ref 30.0–36.0)
MCV: 85.2 fL (ref 78.0–100.0)
Platelets: 161 10*3/uL (ref 150–400)
RBC: 4.8 MIL/uL (ref 4.22–5.81)
RDW: 14 % (ref 11.5–15.5)
WBC: 4.1 10*3/uL (ref 4.0–10.5)

## 2016-11-05 LAB — APTT: aPTT: 28 seconds (ref 24–36)

## 2016-11-05 LAB — COMPREHENSIVE METABOLIC PANEL
ALK PHOS: 43 U/L (ref 38–126)
ALT: 31 U/L (ref 17–63)
ANION GAP: 7 (ref 5–15)
AST: 30 U/L (ref 15–41)
Albumin: 4.3 g/dL (ref 3.5–5.0)
BILIRUBIN TOTAL: 0.8 mg/dL (ref 0.3–1.2)
BUN: 16 mg/dL (ref 6–20)
CALCIUM: 9.1 mg/dL (ref 8.9–10.3)
CO2: 22 mmol/L (ref 22–32)
Chloride: 107 mmol/L (ref 101–111)
Creatinine, Ser: 1.07 mg/dL (ref 0.61–1.24)
Glucose, Bld: 89 mg/dL (ref 65–99)
Potassium: 4.3 mmol/L (ref 3.5–5.1)
SODIUM: 136 mmol/L (ref 135–145)
TOTAL PROTEIN: 7 g/dL (ref 6.5–8.1)

## 2016-11-05 LAB — GLUCOSE, CAPILLARY
GLUCOSE-CAPILLARY: 88 mg/dL (ref 65–99)
GLUCOSE-CAPILLARY: 96 mg/dL (ref 65–99)
Glucose-Capillary: 98 mg/dL (ref 65–99)

## 2016-11-05 LAB — PROTIME-INR
INR: 1.02
PROTHROMBIN TIME: 13.4 s (ref 11.4–15.2)

## 2016-11-05 SURGERY — AMPUTATION, FOOT, RAY
Anesthesia: Regional | Site: Foot | Laterality: Left

## 2016-11-05 MED ORDER — HYDROCODONE-ACETAMINOPHEN 5-325 MG PO TABS
ORAL_TABLET | ORAL | Status: AC
  Administered 2016-11-05: 2 via ORAL
  Filled 2016-11-05: qty 2

## 2016-11-05 MED ORDER — MIDAZOLAM HCL 2 MG/2ML IJ SOLN
INTRAMUSCULAR | Status: AC
  Filled 2016-11-05: qty 2

## 2016-11-05 MED ORDER — 0.9 % SODIUM CHLORIDE (POUR BTL) OPTIME
TOPICAL | Status: DC | PRN
  Administered 2016-11-05: 1000 mL

## 2016-11-05 MED ORDER — FENTANYL CITRATE (PF) 250 MCG/5ML IJ SOLN
INTRAMUSCULAR | Status: AC
  Filled 2016-11-05: qty 5

## 2016-11-05 MED ORDER — FENTANYL CITRATE (PF) 100 MCG/2ML IJ SOLN
INTRAMUSCULAR | Status: AC
  Administered 2016-11-05: 100 ug via INTRAVENOUS
  Filled 2016-11-05: qty 2

## 2016-11-05 MED ORDER — BUPIVACAINE-EPINEPHRINE (PF) 0.5% -1:200000 IJ SOLN
INTRAMUSCULAR | Status: DC | PRN
  Administered 2016-11-05: 30 mL via PERINEURAL

## 2016-11-05 MED ORDER — FENTANYL CITRATE (PF) 100 MCG/2ML IJ SOLN
100.0000 ug | Freq: Once | INTRAMUSCULAR | Status: AC
  Administered 2016-11-05: 100 ug via INTRAVENOUS

## 2016-11-05 MED ORDER — LACTATED RINGERS IV SOLN
INTRAVENOUS | Status: DC
  Administered 2016-11-05 (×2): via INTRAVENOUS

## 2016-11-05 MED ORDER — HYDROCODONE-ACETAMINOPHEN 5-325 MG PO TABS
1.0000 | ORAL_TABLET | Freq: Once | ORAL | Status: AC
  Administered 2016-11-05: 2 via ORAL

## 2016-11-05 MED ORDER — HYDROCODONE-ACETAMINOPHEN 5-325 MG PO TABS: 1.0000 | ORAL_TABLET | Freq: Four times a day (QID) | ORAL | 0 refills | Status: DC | PRN

## 2016-11-05 MED ORDER — PROPOFOL 10 MG/ML IV BOLUS
INTRAVENOUS | Status: AC
  Filled 2016-11-05: qty 20

## 2016-11-05 MED ORDER — CHLORHEXIDINE GLUCONATE 4 % EX LIQD: 60.0000 mL | Freq: Once | CUTANEOUS | Status: DC

## 2016-11-05 SURGICAL SUPPLY — 25 items
BLADE SAW SGTL MED 73X18.5 STR (BLADE) IMPLANT
BLADE SURG 21 STRL SS (BLADE) ×3 IMPLANT
BNDG COHESIVE 4X5 TAN STRL (GAUZE/BANDAGES/DRESSINGS) ×3 IMPLANT
BNDG GAUZE ELAST 4 BULKY (GAUZE/BANDAGES/DRESSINGS) ×3 IMPLANT
COVER SURGICAL LIGHT HANDLE (MISCELLANEOUS) ×6 IMPLANT
DRAPE U-SHAPE 47X51 STRL (DRAPES) ×6 IMPLANT
DRSG ADAPTIC 3X8 NADH LF (GAUZE/BANDAGES/DRESSINGS) ×3 IMPLANT
DRSG PAD ABDOMINAL 8X10 ST (GAUZE/BANDAGES/DRESSINGS) ×6 IMPLANT
DURAPREP 26ML APPLICATOR (WOUND CARE) ×3 IMPLANT
ELECT REM PT RETURN 9FT ADLT (ELECTROSURGICAL) ×3
ELECTRODE REM PT RTRN 9FT ADLT (ELECTROSURGICAL) ×1 IMPLANT
GAUZE SPONGE 4X4 12PLY STRL (GAUZE/BANDAGES/DRESSINGS) ×3 IMPLANT
GLOVE BIOGEL PI IND STRL 9 (GLOVE) ×1 IMPLANT
GLOVE BIOGEL PI INDICATOR 9 (GLOVE) ×2
GLOVE SURG ORTHO 9.0 STRL STRW (GLOVE) ×3 IMPLANT
GOWN STRL REUS W/ TWL XL LVL3 (GOWN DISPOSABLE) ×2 IMPLANT
GOWN STRL REUS W/TWL XL LVL3 (GOWN DISPOSABLE) ×6
KIT BASIN OR (CUSTOM PROCEDURE TRAY) ×3 IMPLANT
KIT ROOM TURNOVER OR (KITS) ×3 IMPLANT
NS IRRIG 1000ML POUR BTL (IV SOLUTION) ×3 IMPLANT
PACK ORTHO EXTREMITY (CUSTOM PROCEDURE TRAY) ×3 IMPLANT
PAD ARMBOARD 7.5X6 YLW CONV (MISCELLANEOUS) ×6 IMPLANT
STOCKINETTE IMPERVIOUS LG (DRAPES) IMPLANT
SUT ETHILON 2 0 PSLX (SUTURE) ×3 IMPLANT
TOWEL OR 17X26 10 PK STRL BLUE (TOWEL DISPOSABLE) ×3 IMPLANT

## 2016-11-05 NOTE — Transfer of Care (Signed)
Immediate Anesthesia Transfer of Care Note  Patient: Mark Cook  Procedure(s) Performed: Procedure(s): 2nd Rik Amputation Left Foot (Left)  Patient Location: PACU  Anesthesia Type:MAC  Level of Consciousness: awake, alert , oriented and patient cooperative  Airway & Oxygen Therapy: Patient Spontanous Breathing and Patient connected to nasal cannula oxygen  Post-op Assessment: Report given to RN and Post -op Vital signs reviewed and stable  Post vital signs: Reviewed and stable  Last Vitals:  Vitals:   11/05/16 1700 11/05/16 1715  BP: 119/78 122/81  Pulse: (!) 48 (!) 48  Resp: 18 18  Temp:      Last Pain:  Vitals:   11/05/16 1624  TempSrc:   PainSc: 0-No pain      Patients Stated Pain Goal: 3 (37/62/83 1517)  Complications: No apparent anesthesia complications

## 2016-11-05 NOTE — Anesthesia Procedure Notes (Signed)
Anesthesia Regional Block: Popliteal block   Pre-Anesthetic Checklist: ,, timeout performed, Correct Patient, Correct Site, Correct Laterality, Correct Procedure, Correct Position, site marked, Risks and benefits discussed,  Surgical consent,  Pre-op evaluation,  At surgeon's request and post-op pain management  Laterality: Left  Prep: chloraprep       Needles:  Injection technique: Single-shot  Needle Type: Stimiplex     Needle Length: 10cm  Needle Gauge: 21     Additional Needles:   Procedures: ultrasound guided, nerve stimulator,,,,,,  Motor weakness within 5 minutes.   Nerve Stimulator or Paresthesia:  Response: Plantar flexion/toe flexion, 0.5 mA,   Additional Responses:   Narrative:  Start time: 11/05/2016 1:06 PM End time: 11/05/2016 1:10 PM Injection made incrementally with aspirations every 5 mL.  Performed by: Personally  Anesthesiologist: Lewie LoronGERMEROTH, Raelin Pixler  Additional Notes: Nerve located and needle positioned with direct ultrasound guidance. Good perineural spread. Patient tolerated well.

## 2016-11-05 NOTE — Progress Notes (Signed)
Orthopedic Tech Progress Note Patient Details:  Mark Cook Kaplan 09/19/1955 [id number]  Ortho Devices Type of Ortho Device: Postop shoe/boot Ortho Device/Splint Location: LLE Ortho Device/Splint Interventions: Ordered, Application   Jennye MoccasinHughes, Chayla Shands Craig 11/05/2016, 4:50 PM

## 2016-11-05 NOTE — Anesthesia Preprocedure Evaluation (Signed)
Anesthesia Evaluation  Patient identified by MRN, date of birth, ID band Patient awake    Reviewed: Allergy & Precautions, NPO status , Patient's Chart, lab work & pertinent test results  Airway Mallampati: II  TM Distance: >3 FB Neck ROM: Full    Dental   Pulmonary neg pulmonary ROS, former smoker,    Pulmonary exam normal breath sounds clear to auscultation       Cardiovascular negative cardio ROS Normal cardiovascular exam Rhythm:Regular Rate:Normal     Neuro/Psych negative neurological ROS  negative psych ROS   GI/Hepatic negative GI ROS, Neg liver ROS,   Endo/Other  diabetes, Poorly Controlled, Oral Hypoglycemic Agents  Renal/GU negative Renal ROS     Musculoskeletal  (+) Arthritis ,   Abdominal   Peds  Hematology negative hematology ROS (+)   Anesthesia Other Findings   Reproductive/Obstetrics negative OB ROS                             Anesthesia Physical Anesthesia Plan  ASA: III  Anesthesia Plan: Regional   Post-op Pain Management:    Induction:   PONV Risk Score and Plan: 1 and Ondansetron and Propofol  Airway Management Planned:   Additional Equipment:   Intra-op Plan:   Post-operative Plan:   Informed Consent: I have reviewed the patients History and Physical, chart, labs and discussed the procedure including the risks, benefits and alternatives for the proposed anesthesia with the patient or authorized representative who has indicated his/her understanding and acceptance.   Dental advisory given  Plan Discussed with: CRNA  Anesthesia Plan Comments:         Anesthesia Quick Evaluation
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
## 2016-11-05 NOTE — Op Note (Signed)
11/05/2016  4:10 PM  PATIENT:  Mark Cook    PRE-OPERATIVE DIAGNOSIS:  Osteomyelitis Left 2nd Toe  POST-OPERATIVE DIAGNOSIS:  Same  PROCEDURE:  2nd Laderius Amputation Left Foot  SURGEON:  Nadara MustardMarcus V Naythan Douthit, MD  PHYSICIAN ASSISTANT:None ANESTHESIA:   General  PREOPERATIVE INDICATIONS:  Mark Cook is a  61 y.o. male with a diagnosis of Osteomyelitis Left 2nd Toe who failed conservative measures and elected for surgical management.    The risks benefits and alternatives were discussed with the patient preoperatively including but not limited to the risks of infection, bleeding, nerve injury, cardiopulmonary complications, the need for revision surgery, among others, and the patient was willing to proceed.  OPERATIVE IMPLANTS: none   OPERATIVE FINDINGS: good bleeding, no deep abscess  OPERATIVE PROCEDURE: Patient was brought the operating room after undergoing a regional anesthesia. After adequate levels anesthesia were obtained patient's left lower extremity was prepped using DuraPrep draped into a sterile field a timeout was called. A racquet incision was made around the toe left foot second toe extending dorsally. There is no plantar incision. The metatarsal was resected through the midshaft of the second metatarsal and the infected toe was resected in 1 block of tissue. Electrocautery was used for hemostasis. The wound was irrigated with normal saline. The incision was closed using 2-0 nylon. A sterile compressive dressing was applied patient was taken to the PACU in stable condition.

## 2016-11-07 ENCOUNTER — Encounter (HOSPITAL_COMMUNITY): Payer: Self-pay | Admitting: Orthopedic Surgery

## 2016-11-07 NOTE — Anesthesia Postprocedure Evaluation (Signed)
Anesthesia Post Note  Patient: Mark Cook  Procedure(s) Performed: Procedure(s) (LRB): 2nd Brallan Amputation Left Foot (Left)     Patient location during evaluation: PACU Anesthesia Type: Regional and MAC Level of consciousness: awake and alert Pain management: pain level controlled Vital Signs Assessment: post-procedure vital signs reviewed and stable Respiratory status: spontaneous breathing, nonlabored ventilation, respiratory function stable and patient connected to nasal cannula oxygen Cardiovascular status: stable and blood pressure returned to baseline Anesthetic complications: no    Last Vitals:  Vitals:   11/05/16 1715 11/05/16 1730  BP: 122/81 126/78  Pulse: (!) 48 (!) 49  Resp: 18 (!) 23  Temp:  36.7 C    Last Pain:  Vitals:   11/05/16 1730  TempSrc:   PainSc: 0-No pain                 Sura Canul,JAMES TERRILL

## 2016-11-09 LAB — GLUCOSE, CAPILLARY: Glucose-Capillary: 20 mg/dL — CL (ref 65–99)

## 2016-11-12 ENCOUNTER — Ambulatory Visit (INDEPENDENT_AMBULATORY_CARE_PROVIDER_SITE_OTHER): Payer: Medicare HMO | Admitting: Family

## 2016-11-12 ENCOUNTER — Encounter (INDEPENDENT_AMBULATORY_CARE_PROVIDER_SITE_OTHER): Payer: Self-pay | Admitting: Family

## 2016-11-12 VITALS — Ht 76.0 in | Wt 246.0 lb

## 2016-11-12 DIAGNOSIS — Z89422 Acquired absence of other left toe(s): Secondary | ICD-10-CM

## 2016-11-12 DIAGNOSIS — I96 Gangrene, not elsewhere classified: Secondary | ICD-10-CM

## 2016-11-15 ENCOUNTER — Encounter: Payer: Self-pay | Admitting: Family Medicine

## 2016-11-15 ENCOUNTER — Ambulatory Visit (INDEPENDENT_AMBULATORY_CARE_PROVIDER_SITE_OTHER): Payer: Medicare HMO | Admitting: Family Medicine

## 2016-11-15 DIAGNOSIS — M86272 Subacute osteomyelitis, left ankle and foot: Secondary | ICD-10-CM | POA: Diagnosis not present

## 2016-11-15 DIAGNOSIS — Z89422 Acquired absence of other left toe(s): Secondary | ICD-10-CM | POA: Diagnosis not present

## 2016-11-15 MED ORDER — DOXYCYCLINE HYCLATE 100 MG PO TABS: 100.0000 mg | ORAL_TABLET | Freq: Two times a day (BID) | ORAL | 0 refills | Status: DC
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
## 2016-11-15 NOTE — Patient Instructions (Signed)
I sent in an antibiotic that I want you to start taking right away. See us immediately if redness worsens. See your orthopedist as already schedule for recheck and suture removal.

## 2016-11-15 NOTE — Progress Notes (Signed)
   Subjective:    Patient ID: Mark Cook, male    DOB: 07/10/1955, 61 y.o.   MRN: [id number]  HPI Saw patient after nurse triage.  POD #10.  Patient had car door close on left foot in midfoot region.  Did not slam but force was hard enough for door to bounce back.  Having some drainage of toe.  Stitches are due to be removed later this week.    Review of Systems     Objective:   Physical Exam  Stitches intact.  Slight gapping and serosanguinous drainage in what would normally be the we space.  Mild erythema with some blistering at stitches.  Does not extend up the foot.  All areas are warm and pink.  No bruising or break of skin at mid-foot where car door hit.        Assessment & Plan:

## 2016-11-15 NOTE — Progress Notes (Signed)
Patient here today after slamming left foot with car door. S/p 2nd Felipe amputation of left foot on 6/29. Pecepted with Dr. Leveda AnnaHensel, see his note for details

## 2016-11-15 NOTE — Assessment & Plan Note (Signed)
I am less worried about trauma from the car door than I am about surg site infection. Will start on doxy now.  See AVS for FU

## 2016-11-19 ENCOUNTER — Ambulatory Visit (INDEPENDENT_AMBULATORY_CARE_PROVIDER_SITE_OTHER): Payer: Medicare HMO | Admitting: Family

## 2016-11-24 ENCOUNTER — Ambulatory Visit (INDEPENDENT_AMBULATORY_CARE_PROVIDER_SITE_OTHER): Payer: Medicare HMO | Admitting: Family

## 2016-11-24 DIAGNOSIS — E1142 Type 2 diabetes mellitus with diabetic polyneuropathy: Secondary | ICD-10-CM

## 2016-11-24 DIAGNOSIS — Z89422 Acquired absence of other left toe(s): Secondary | ICD-10-CM
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
## 2016-12-01 ENCOUNTER — Encounter: Payer: Self-pay | Admitting: Family Medicine

## 2016-12-01 ENCOUNTER — Ambulatory Visit (INDEPENDENT_AMBULATORY_CARE_PROVIDER_SITE_OTHER): Payer: Medicare HMO | Admitting: Family Medicine

## 2016-12-01 VITALS — BP 142/74 | HR 71 | Temp 97.3°F | Ht 76.0 in | Wt 244.2 lb

## 2016-12-01 DIAGNOSIS — Z89422 Acquired absence of other left toe(s): Secondary | ICD-10-CM | POA: Diagnosis not present

## 2016-12-01 NOTE — Progress Notes (Signed)
    Subjective:  Mark Cook is a 61 y.o. male who presents [facility] the [facility] today for redness and swelling of his post surgical area on his left foot  HPI:  Left foot subacute osteomyelitis s/p 2nd Mark Cook amputation:  Amputation by Dr. Lajoyce Cornersuda four weeks ago. [facility] for concern of infection and prescribed course of doxycycline. Return visit to orthopedic office 7/18 and had sutures harvested and no signs/symptoms of infection.  He is currently full weightbearing with a cane and Darco shoe.  He states that area just proximal to incision sites became red and swollen over past few days.  Swelling does get worse after walking all day and seems to be improved in the AM.   ROS: no fevers, chills, obvious drainage  PMH: diabetic neuropathy, subacute osteomyelitis L foot s/p 2nd Mark Cook amputation Tobacco use: none Medication: reviewed and updated ROS: see HPI   Objective:  Physical Exam: BP (!) 142/74 (BP Location: Left Arm, Patient Position: Sitting, Cuff Size: Normal)   Pulse 71   Temp (!) 97.3 F (36.3 C) (Oral)   Ht 6\' 4"  (1.93 m)   Wt 244 lb 3.2 oz (110.8 kg)   SpO2 96%   BMI 29.72 kg/m   Gen: 61 yo M in NAD, resting comfortably CV: RRR with no murmurs appreciated Pulm: NWOB, CTAB with no crackles, wheezes, or rhonchi GI: Normal bowel sounds present. Soft, Nontender, Nondistended. MSK: Left foot with 2nd Mark Cook amputation and 4cm healing incision with granulation tissue and surrounding erythema and without obvious fluctuance or drainage Extremities: no cyanosis or clubbing.  Does have significant edema in L foot and ankle compared to right Neuro: decreased sensation in LLE, with improved sensation on lateral aspect of L foot. Moves all extremities  No results found for this or any previous visit (from the past 72 hour(s)).     Assessment/Plan:  Status post amputation of lesser toe of left foot (HCC) Unclear if area proximal to amputation is infected vs: dependent edema. Patient cannot  assess pain in this extremity. He has no fevers or chills and area is non-erythematous.  Dependent edema may actually be fluctuance, but no pus was expressed on exam.  - scheduled follow up appointment with Dr. Audrie Liauda's office for 7/26 at 10:15AM.  - strict return precautions discussed with patient - holding off on antibiotic until assessed by orthopedics

## 2016-12-01 NOTE — Assessment & Plan Note (Signed)
Unclear if area proximal to amputation is infected vs: dependent edema. Patient cannot assess pain in this extremity. He has no fevers or chills and area is non-erythematous.  Dependent edema may actually be fluctuance, but no pus was expressed on exam.  - scheduled follow up appointment with Dr. Audrie Liauda's office for 7/26 at 10:15AM.  - strict return precautions discussed with patient - holding off on antibiotic until assessed by orthopedics

## 2016-12-01 NOTE — Patient Instructions (Addendum)
Mark Cook, you were seen today for some redness and swelling at your amputation site.  Since you do not have feeling in your foot, I am not able to assess for pain and am unsure if this is an infection or just swelling.  I have scheduled an appointment for you to be seen at Dr. Audrie Liauda's office tomorrow 7/26 at 10:15AM.   I would recommend elevating your leg and wearing a compression sock to help prevent the swelling.    It was very nice to see you today, Mark Zerkle L. Myrtie SomanWarden, MD [facility] Resident PGY-2 12/01/2016 4:13 PM

## 2016-12-02 ENCOUNTER — Encounter (INDEPENDENT_AMBULATORY_CARE_PROVIDER_SITE_OTHER): Payer: Self-pay | Admitting: Family

## 2016-12-02 ENCOUNTER — Ambulatory Visit (INDEPENDENT_AMBULATORY_CARE_PROVIDER_SITE_OTHER): Payer: Medicare HMO | Admitting: Orthopedic Surgery

## 2016-12-02 DIAGNOSIS — Z89422 Acquired absence of other left toe(s): Secondary | ICD-10-CM
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
## 2016-12-08 ENCOUNTER — Ambulatory Visit (INDEPENDENT_AMBULATORY_CARE_PROVIDER_SITE_OTHER): Payer: Medicare HMO | Admitting: Family

## 2017-01-03 ENCOUNTER — Ambulatory Visit (INDEPENDENT_AMBULATORY_CARE_PROVIDER_SITE_OTHER): Payer: Medicare HMO | Admitting: Orthopedic Surgery

## 2017-01-18 ENCOUNTER — Other Ambulatory Visit: Payer: Self-pay | Admitting: Family Medicine

## 2017-01-18 DIAGNOSIS — E119 Type 2 diabetes mellitus without complications: Secondary | ICD-10-CM

## 2017-01-19 ENCOUNTER — Ambulatory Visit (INDEPENDENT_AMBULATORY_CARE_PROVIDER_SITE_OTHER): Payer: Medicare HMO | Admitting: Family

## 2017-01-19 DIAGNOSIS — E1142 Type 2 diabetes mellitus with diabetic polyneuropathy: Secondary | ICD-10-CM

## 2017-01-19 DIAGNOSIS — L97521 Non-pressure chronic ulcer of other part of left foot limited to breakdown of skin: Secondary | ICD-10-CM
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
## 2017-01-20 ENCOUNTER — Encounter: Payer: Self-pay | Admitting: Internal Medicine

## 2017-01-20 ENCOUNTER — Encounter: Payer: Self-pay | Admitting: Gastroenterology

## 2017-01-20 ENCOUNTER — Ambulatory Visit (INDEPENDENT_AMBULATORY_CARE_PROVIDER_SITE_OTHER): Payer: Medicare HMO | Admitting: Internal Medicine

## 2017-01-20 VITALS — BP 112/62 | HR 61 | Temp 97.8°F | Ht 76.0 in | Wt 248.2 lb

## 2017-01-20 DIAGNOSIS — M545 Low back pain: Secondary | ICD-10-CM | POA: Diagnosis not present

## 2017-01-20 DIAGNOSIS — E1142 Type 2 diabetes mellitus with diabetic polyneuropathy: Secondary | ICD-10-CM | POA: Diagnosis not present

## 2017-01-20 DIAGNOSIS — Z0000 Encounter for general adult medical examination without abnormal findings: Secondary | ICD-10-CM | POA: Diagnosis not present

## 2017-01-20 DIAGNOSIS — N529 Male erectile dysfunction, unspecified: Secondary | ICD-10-CM | POA: Diagnosis not present

## 2017-01-20 DIAGNOSIS — E6609 Other obesity due to excess calories: Secondary | ICD-10-CM | POA: Diagnosis not present

## 2017-01-20 DIAGNOSIS — G8929 Other chronic pain: Secondary | ICD-10-CM | POA: Diagnosis not present

## 2017-01-20 DIAGNOSIS — Z1211 Encounter for screening for malignant neoplasm of colon: Secondary | ICD-10-CM

## 2017-01-20 LAB — POCT GLYCOSYLATED HEMOGLOBIN (HGB A1C): HEMOGLOBIN A1C: 6

## 2017-01-20 MED ORDER — SILDENAFIL CITRATE 100 MG PO TABS: 50.0000 mg | ORAL_TABLET | Freq: Every day | ORAL | 5 refills | Status: DC | PRN

## 2017-01-20 MED ORDER — MELOXICAM 15 MG PO TABS: 15.0000 mg | ORAL_TABLET | Freq: Every day | ORAL | 3 refills | Status: DC

## 2017-01-20 NOTE — Assessment & Plan Note (Signed)
Taking ibuprofen  TID daily for back and other joint pains. As patient taking so regularly, will transition to Mobic for GI protection.  - Begin Mobic  qd - Discussed discontinuing ibuprofen and other NSAIDs

## 2017-01-20 NOTE — Progress Notes (Signed)
61 y.o. year old male presents for well male/preventative visit.  Acute Concerns: Memory concerns Is concerned because he has been having difficulty with directions recently. Says in the past driving somewhere well known to him was automatic, but now he has to map out the route in his mind before he leaves. He still knows how to get to the destination, just has to think about it more. Has never gotten lost or forgotten where he is going. Is not having issues with memory otherwise.   Erectile dysfunction Having difficulty achieving erection. Was previously prescribed Viagra which worked well but said it was too expensive. Is wondering if it may be cheaper now.   Type II DM Taking metformin 500mg  BID. Does not measure blood sugar at home. Has been trying to improve his diet some.   Arthritis Takes 200mg  ibuprofen TID to help with joint pains. This controls his symptoms well.    Diet: Says there is room for improvement but he is trying to work on his diet thanks to the encouragement of his daughter. Primarily eats out, however if his daughter is at home she usually cooks for him. When she is away at college he may only eat at home about twice per week. Is trying to decrease amount of snacks he is eating, particularyl potato chips. Typically has cereal for breakfast, chicken sandwiches or a hamburger at lunch, and chicken with a salad for dinner. Drinking sweet tea during the day which he knows he needs to cut down on. Occasionally drinks soda but he has cut back on this. Drinks water as well.    Exercise: Formerly had a Research scientist (physical sciences)membership at [employer] when his son worked there but now that his son has switched jobs he has stopped going. Would like to join again but hasn't gotten around to it. Is mostly driving during the day so doesn't get much exercise.   Sexual History: Sexually active (see above)  Surgical History: Past Surgical History:  Procedure Laterality Date  . AMPUTATION Left  11/05/2016   Procedure: 2nd Ilai Amputation Left Foot;  Surgeon: Nadara Mustarduda, Marcus V, MD;  [facility];  Service: Orthopedics;  Laterality: Left;  . NO PAST SURGERIES      Allergies: Allergies  Allergen Reactions  . No Known Allergies     Social:  Social History   Social History  . Marital status: Married    Spouse name: N/A  . Number of children: N/A  . Years of education: N/A   Social History Main Topics  . Smoking status: Former Smoker    Years: 10.00  . Smokeless tobacco: Never Used     Comment: quit 2014  . Alcohol use 1.2 oz/week    2 Shots of liquor per week  . Drug use: No  . Sexual activity: Yes   Other Topics Concern  . None   Social History Narrative  . None   Maybe one beer every other day. Sometimes wine or liquor for special events, usually only one serving.   Immunization:  There is no immunization history on file for this patient.  Cancer Screening:  Colonoscopy: Never performed but would like referral today  Prostate Exam: Never performed. Denies any urinary sx.    Physical Exam: VITALS: Reviewed GEN: Pleasant male, NAD HEENT: Normocephalic, PERRL, EOMI, no scleral icterus, bilateral TM pearly grey, nasal septum midline, MMM, uvula midline, no anterior or posterior lymphadenopathy, no thyromegaly CARDIAC: RRR, S1 and S2 present, no murmur, no heaves/thrills RESP: CTAB, normal  effort ABD: Soft, no tenderness, normal bowel sounds EXT: No edema, 2+ radial and DP pulses SKIN: Warm and dry, no rash  ASSESSMENT & PLAN: 61 y.o. male presents for annual well male/preventative exam. Please see problem specific assessment and plan.   Type 2 diabetes mellitus (HCC) Well-controlled, with A1C 6.0 today.  - Continue metformin  BID - Encouraged to schedule appointment with ophtho (last went 3 years ago)  Erectile dysfunction Interested in resuming Viagra now that it is more affordable.  - Refilled Viagra 50-100mg  PRN. Printed Good Rx coupon for  patient.   Left low back pain Taking ibuprofen  TID daily for back and other joint pains. As patient taking so regularly, will transition to Mobic for GI protection.  - Begin Mobic  qd - Discussed discontinuing ibuprofen and other NSAIDs  Healthcare maintenance - Referral to GI for colonoscopy - Lipid panel today. Will call if any abnormal results.    Tarri Abernethy, MD, MPH PGY-3 [facility] Pager [phone]

## 2017-01-20 NOTE — Assessment & Plan Note (Signed)
Interested in resuming Viagra now that it is more affordable.  - Refilled Viagra 50-100mg  PRN. Printed Good Rx coupon for patient.

## 2017-01-20 NOTE — Assessment & Plan Note (Signed)
-   Referral to GI for colonoscopy - Lipid panel today. Will call if any abnormal results.

## 2017-01-20 NOTE — Patient Instructions (Addendum)
It was nice meeting you today Mr. Mark Cook!  Your A1C was perfect today at 6.0! Keep up the great work! Please continue taking your metformin as you have been.   For arthritis, you can begin taking one Mobic tablet (15mg ) a day. DO NOT take ibuprofen along with Mobic, or any similar medications (Motrin, Advil, naproxen, etc).   I will let you know if there are any abnormalities with your cholesterol when we get the results back next week.   You will be contacted by the GI doctor's office with the date and time of your colonoscopy.   I will see you back in one year for your next check up, or sooner if you need us.   If you have any questions or concerns, please feel free to call the clinic.   Be well,  Dr. Natale MilchLancaster

## 2017-01-20 NOTE — Assessment & Plan Note (Signed)
Well-controlled, with A1C 6.0 today.  - Continue metformin  BID - Encouraged to schedule appointment with ophtho (last went 3 years ago)

## 2017-01-21 LAB — LIPID PANEL
CHOLESTEROL TOTAL: 156 mg/dL (ref 100–199)
Chol/HDL Ratio: 4.3 ratio (ref 0.0–5.0)
HDL: 36 mg/dL — ABNORMAL LOW (ref >39)
LDL Calculated: 80 mg/dL (ref 0–99)
Triglycerides: 199 mg/dL — ABNORMAL HIGH (ref 0–149)
VLDL CHOLESTEROL CAL: 40 mg/dL (ref 5–40)

## 2017-03-11 ENCOUNTER — Telehealth: Payer: Self-pay

## 2017-03-11 ENCOUNTER — Telehealth: Payer: Self-pay | Admitting: *Deleted

## 2017-03-11 NOTE — Telephone Encounter (Signed)
Patient did not show up for his pre-visit appointment today. Patient was called to reschedule PV. Phone message said that a voice mail had not been set up so I was not able to leave a message. No answer each time. If patient doesn't call to reschedule by 5:00 pm. I will cancel colonoscopy that is scheduled for 03/16/17 per [facility] guidelines. A no show letter will be sent out today.  Janalee DaneNancy Eliceo Gladu, LPN  ( PV )

## 2017-03-11 NOTE — Telephone Encounter (Signed)
Patient no show pv today. Called patient, no answer. No voicemail set up, unable to leave message.

## 2017-03-16 ENCOUNTER — Encounter: Payer: Medicare HMO | Admitting: Gastroenterology

## 2017-03-28 ENCOUNTER — Encounter: Payer: Self-pay | Admitting: Internal Medicine

## 2017-03-29 ENCOUNTER — Encounter: Payer: Medicare HMO | Admitting: Gastroenterology

## 2017-04-04 ENCOUNTER — Telehealth: Payer: Self-pay | Admitting: *Deleted

## 2017-04-04 NOTE — Telephone Encounter (Signed)
Jennifer from [facility] on [address] wanted to let you know that the patient is taking more Meloxicam than Rx'd and is running out early.  She cant refill the med at this time but wanted to let the provider know in case she wanted to change the med. Hilarie Sinha, Maryjo RochesterJessica Dawn, CMA

## 2017-04-04 NOTE — Telephone Encounter (Signed)
Noted.  Chele Cornell J Giles Currie, MD, MPH PGY-3 [facility] Pager [phone]  

## 2017-04-07 ENCOUNTER — Other Ambulatory Visit: Payer: Self-pay | Admitting: Internal Medicine

## 2017-04-07 DIAGNOSIS — E119 Type 2 diabetes mellitus without complications: Secondary | ICD-10-CM

## 2017-06-29 ENCOUNTER — Other Ambulatory Visit: Payer: Self-pay | Admitting: Internal Medicine

## 2017-06-29 DIAGNOSIS — N529 Male erectile dysfunction, unspecified: Secondary | ICD-10-CM

## 2017-08-23 ENCOUNTER — Other Ambulatory Visit: Payer: Self-pay | Admitting: Internal Medicine

## 2017-08-23 DIAGNOSIS — N529 Male erectile dysfunction, unspecified: Secondary | ICD-10-CM

## 2017-09-16 ENCOUNTER — Other Ambulatory Visit: Payer: Self-pay | Admitting: Internal Medicine

## 2017-09-16 DIAGNOSIS — N529 Male erectile dysfunction, unspecified: Secondary | ICD-10-CM

## 2017-10-23 ENCOUNTER — Other Ambulatory Visit: Payer: Self-pay | Admitting: Internal Medicine

## 2017-10-23 DIAGNOSIS — N529 Male erectile dysfunction, unspecified: Secondary | ICD-10-CM

## 2017-11-21 ENCOUNTER — Other Ambulatory Visit: Payer: Self-pay | Admitting: *Deleted

## 2017-11-21 DIAGNOSIS — N529 Male erectile dysfunction, unspecified: Secondary | ICD-10-CM

## 2017-11-21 MED ORDER — SILDENAFIL CITRATE 100 MG PO TABS: ORAL_TABLET | ORAL | 0 refills | Status: DC

## 2017-12-30 ENCOUNTER — Other Ambulatory Visit: Payer: Self-pay | Admitting: Family Medicine

## 2017-12-30 DIAGNOSIS — N529 Male erectile dysfunction, unspecified: Secondary | ICD-10-CM

## 2018-01-20 ENCOUNTER — Other Ambulatory Visit: Payer: Self-pay

## 2018-01-20 DIAGNOSIS — N529 Male erectile dysfunction, unspecified: Secondary | ICD-10-CM

## 2018-01-22 MED ORDER — MELOXICAM 15 MG PO TABS: 15.0000 mg | ORAL_TABLET | Freq: Every day | ORAL | 3 refills | Status: DC

## 2018-01-22 MED ORDER — SILDENAFIL CITRATE 100 MG PO TABS: 50.0000 mg | ORAL_TABLET | ORAL | 0 refills | Status: DC | PRN

## 2018-02-17 ENCOUNTER — Other Ambulatory Visit: Payer: Self-pay | Admitting: Family Medicine

## 2018-02-17 DIAGNOSIS — N529 Male erectile dysfunction, unspecified: Secondary | ICD-10-CM

## 2018-03-24 ENCOUNTER — Other Ambulatory Visit: Payer: Self-pay | Admitting: Family Medicine

## 2018-03-24 DIAGNOSIS — N529 Male erectile dysfunction, unspecified: Secondary | ICD-10-CM

## 2018-04-18 ENCOUNTER — Other Ambulatory Visit: Payer: Self-pay | Admitting: Family Medicine

## 2018-04-18 DIAGNOSIS — N529 Male erectile dysfunction, unspecified: Secondary | ICD-10-CM

## 2018-05-19 ENCOUNTER — Other Ambulatory Visit: Payer: Self-pay | Admitting: Family Medicine

## 2018-05-19 DIAGNOSIS — N529 Male erectile dysfunction, unspecified: Secondary | ICD-10-CM

## 2018-06-20 ENCOUNTER — Other Ambulatory Visit: Payer: Self-pay | Admitting: Family Medicine

## 2018-06-20 DIAGNOSIS — N529 Male erectile dysfunction, unspecified: Secondary | ICD-10-CM

## 2018-06-27 ENCOUNTER — Ambulatory Visit (HOSPITAL_COMMUNITY)
Admission: EM | Admit: 2018-06-27 | Discharge: 2018-06-27 | Disposition: A | Discharge status: Home / Self Care | Payer: Medicare HMO | Attending: Family Medicine | Admitting: Family Medicine

## 2018-06-27 ENCOUNTER — Ambulatory Visit (INDEPENDENT_AMBULATORY_CARE_PROVIDER_SITE_OTHER): Payer: Medicare HMO

## 2018-06-27 ENCOUNTER — Encounter (HOSPITAL_COMMUNITY): Payer: Self-pay

## 2018-06-27 DIAGNOSIS — M1712 Unilateral primary osteoarthritis, left knee: Secondary | ICD-10-CM | POA: Diagnosis not present

## 2018-06-27 DIAGNOSIS — M25562 Pain in left knee: Secondary | ICD-10-CM

## 2018-06-27 MED ORDER — LIDOCAINE HCL (PF) 2 % IJ SOLN
INTRAMUSCULAR | Status: AC
  Filled 2018-06-27: qty 2

## 2018-06-27 MED ORDER — METHYLPREDNISOLONE ACETATE 80 MG/ML IJ SUSP
INTRAMUSCULAR | Status: AC
  Filled 2018-06-27: qty 1

## 2018-06-27 MED ORDER — PENTAFLUOROPROP-TETRAFLUOROETH EX AERO
INHALATION_SPRAY | CUTANEOUS | Status: AC
  Filled 2018-06-27: qty 116

## 2018-06-27 NOTE — ED Triage Notes (Signed)
Pt presents with ongoing pain in back of both legs and pain in left knee.

## 2018-06-27 NOTE — Discharge Instructions (Addendum)
Steroid injection given in the knee today Make sure that you rest, ice the knee and elevate the knee for the next couple days I believe that the cramping in the hamstring area is due to muscle fatigue. When the knee is feeling better you may want to go to the gym and do some light exercises to help build the muscles in the upper legs, and make sure that you are stretching daily Follow up as needed for continued or worsening symptoms
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
## 2018-06-28 ENCOUNTER — Ambulatory Visit: Payer: Medicare HMO | Admitting: Family Medicine

## 2018-07-12 ENCOUNTER — Other Ambulatory Visit: Payer: Self-pay | Admitting: Family Medicine

## 2018-07-12 DIAGNOSIS — N529 Male erectile dysfunction, unspecified: Secondary | ICD-10-CM

## 2018-08-25 ENCOUNTER — Other Ambulatory Visit: Payer: Self-pay | Admitting: Family Medicine

## 2018-08-25 DIAGNOSIS — N529 Male erectile dysfunction, unspecified: Secondary | ICD-10-CM

## 2018-09-13 ENCOUNTER — Other Ambulatory Visit: Payer: Self-pay | Admitting: Family Medicine

## 2018-09-13 DIAGNOSIS — N529 Male erectile dysfunction, unspecified: Secondary | ICD-10-CM

## 2018-10-21 ENCOUNTER — Other Ambulatory Visit: Payer: Self-pay | Admitting: Family Medicine

## 2018-10-21 DIAGNOSIS — N529 Male erectile dysfunction, unspecified: Secondary | ICD-10-CM

## 2018-11-14 ENCOUNTER — Other Ambulatory Visit: Payer: Self-pay

## 2018-11-14 ENCOUNTER — Ambulatory Visit (INDEPENDENT_AMBULATORY_CARE_PROVIDER_SITE_OTHER): Payer: Self-pay | Admitting: Family Medicine

## 2018-11-14 ENCOUNTER — Encounter: Payer: Self-pay | Admitting: Family Medicine

## 2018-11-14 DIAGNOSIS — N529 Male erectile dysfunction, unspecified: Secondary | ICD-10-CM

## 2018-11-14 DIAGNOSIS — M25559 Pain in unspecified hip: Secondary | ICD-10-CM | POA: Insufficient documentation

## 2018-11-14 DIAGNOSIS — K921 Melena: Secondary | ICD-10-CM

## 2018-11-14 DIAGNOSIS — Z1211 Encounter for screening for malignant neoplasm of colon: Secondary | ICD-10-CM | POA: Insufficient documentation

## 2018-11-14 DIAGNOSIS — M25551 Pain in right hip: Secondary | ICD-10-CM

## 2018-11-14 DIAGNOSIS — Z1212 Encounter for screening for malignant neoplasm of rectum: Secondary | ICD-10-CM

## 2018-11-14 HISTORY — DX: Melena: K92.1

## 2018-11-14 MED ORDER — CYCLOBENZAPRINE HCL 10 MG PO TABS: 10.0000 mg | ORAL_TABLET | Freq: Every day | ORAL | 0 refills | Status: DC

## 2018-11-14 MED ORDER — SILDENAFIL CITRATE 100 MG PO TABS: ORAL_TABLET | ORAL | 0 refills | Status: DC
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
## 2018-11-14 NOTE — Assessment & Plan Note (Signed)
Refill sildenafil. 

## 2018-11-14 NOTE — Assessment & Plan Note (Signed)
-  Consult to GI for colonoscopy
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
## 2018-11-16 ENCOUNTER — Encounter: Payer: Self-pay | Admitting: Gastroenterology

## 2018-11-28 ENCOUNTER — Other Ambulatory Visit: Payer: Self-pay | Admitting: Family Medicine

## 2018-11-28 DIAGNOSIS — N529 Male erectile dysfunction, unspecified: Secondary | ICD-10-CM

## 2018-12-08 ENCOUNTER — Ambulatory Visit (AMBULATORY_SURGERY_CENTER): Payer: Self-pay | Admitting: *Deleted

## 2018-12-08 ENCOUNTER — Other Ambulatory Visit: Payer: Self-pay

## 2018-12-08 ENCOUNTER — Telehealth: Payer: Self-pay | Admitting: *Deleted

## 2018-12-08 VITALS — Ht 76.0 in | Wt 245.0 lb

## 2018-12-08 DIAGNOSIS — Z1211 Encounter for screening for malignant neoplasm of colon: Secondary | ICD-10-CM

## 2018-12-08 MED ORDER — NA SULFATE-K SULFATE-MG SULF 17.5-3.13-1.6 GM/177ML PO SOLN: ORAL | 0 refills | Status: DC

## 2018-12-08 NOTE — Telephone Encounter (Signed)
Called patient for PV, no answser, "mail box full" so I was unable to leave a message. I will try again later.

## 2018-12-08 NOTE — Progress Notes (Signed)
Patient's pre-visit was done today over the phone with the patient due to COVID-19 pandemic. Name,DOB and address verified. Insurance verified. Packet of Prep instructions mailed to patient including copy of a consent form and pre-procedure patient acknowledgement form-pt is aware. Patient understands to call us back with any questions or concerns. Prep instructions went over the pt very slowly. Patient denies any allergies to eggs or soy. Patient denies any problems with anesthesia/sedation. Patient denies any oxygen use at home. Patient denies taking any diet/weight loss medications or blood thinners. EMMI education assisgned to patient on colonoscopy, this was explained and instructions given to patient. Pt is aware that care partner will wait in the car during procedure; if they feel like they will be too hot to wait in the car; they may wait in the lobby.  We want them to wear a mask (we do not have any that we can provide them), practice social distancing, and we will check their temperatures when they get here.  I did remind patient that their care partner needs to stay in the parking lot the entire time. Pt will wear mask into building.

## 2018-12-08 NOTE — Telephone Encounter (Signed)
PV completed with patient.  ?

## 2018-12-21 ENCOUNTER — Telehealth: Payer: Self-pay | Admitting: *Deleted

## 2018-12-21 ENCOUNTER — Telehealth: Payer: Self-pay | Admitting: Gastroenterology

## 2018-12-21 NOTE — Telephone Encounter (Signed)
Pt RS colon to Wed 9-16- needs new instructions mailed - MAILED 9-16 INSTRUCTIONS TO PT- mARIE pv

## 2018-12-22 ENCOUNTER — Encounter: Payer: Medicare HMO | Admitting: Gastroenterology

## 2018-12-23 ENCOUNTER — Other Ambulatory Visit: Payer: Self-pay | Admitting: Family Medicine

## 2018-12-23 DIAGNOSIS — N529 Male erectile dysfunction, unspecified: Secondary | ICD-10-CM

## 2019-01-22 ENCOUNTER — Ambulatory Visit (INDEPENDENT_AMBULATORY_CARE_PROVIDER_SITE_OTHER): Payer: Medicare HMO | Admitting: Family Medicine

## 2019-01-22 ENCOUNTER — Other Ambulatory Visit: Payer: Self-pay

## 2019-01-22 VITALS — BP 118/72 | HR 72 | Wt 247.0 lb

## 2019-01-22 DIAGNOSIS — G8929 Other chronic pain: Secondary | ICD-10-CM | POA: Diagnosis not present

## 2019-01-22 DIAGNOSIS — N529 Male erectile dysfunction, unspecified: Secondary | ICD-10-CM | POA: Diagnosis not present

## 2019-01-22 DIAGNOSIS — M25562 Pain in left knee: Secondary | ICD-10-CM | POA: Diagnosis not present

## 2019-01-22 DIAGNOSIS — E1142 Type 2 diabetes mellitus with diabetic polyneuropathy: Secondary | ICD-10-CM | POA: Diagnosis not present

## 2019-01-22 LAB — POCT GLYCOSYLATED HEMOGLOBIN (HGB A1C): HbA1c, POC (controlled diabetic range): 5.8 % (ref 0.0–7.0)

## 2019-01-22 MED ORDER — ACETAMINOPHEN 500 MG PO TABS: 500.0000 mg | ORAL_TABLET | Freq: Four times a day (QID) | ORAL | 0 refills | Status: DC | PRN

## 2019-01-22 MED ORDER — SILDENAFIL CITRATE 100 MG PO TABS: ORAL_TABLET | ORAL | 0 refills | Status: DC

## 2019-01-22 MED ORDER — IBUPROFEN 600 MG PO TABS: 600.0000 mg | ORAL_TABLET | Freq: Three times a day (TID) | ORAL | 1 refills | Status: DC | PRN
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
## 2019-01-22 NOTE — Patient Instructions (Signed)
I would like you to get an X [place] of your knee to rule out osteoarthritis.  I have prescribed tylenol and ibuprofen, which you can alternate throughout the day.  You can alternate each one every 4 hours if you want.  You can also pick these medications up over the counter as well.    I would like you to follow up with your PCP in 2 to 4 weeks to discuss long term treatments of this knee pain if it is from arthritis.    If you would like to use a cane, this can take some pressure off your leg throughout the day and will make it less likely to become painful and swollen as the day goes on.    Your A1c was normal today.  Continue to take your diabetes medication   Have a great day,   Clemetine Marker, MD

## 2019-01-23 ENCOUNTER — Telehealth: Payer: Self-pay

## 2019-01-23 DIAGNOSIS — M1712 Unilateral primary osteoarthritis, left knee: Secondary | ICD-10-CM | POA: Insufficient documentation

## 2019-01-23 DIAGNOSIS — M25569 Pain in unspecified knee: Secondary | ICD-10-CM | POA: Insufficient documentation

## 2019-01-23 NOTE — Assessment & Plan Note (Signed)
BP stable.  Uses /5-1 tab 3-4 times a week.  Pt would like more refills available so he will not have to call so often to ask for refills.   - prescribed one months worth of sildenafil.  - advised pt to discuss with pcp regarding long term prescription of this medication.

## 2019-01-23 NOTE — Assessment & Plan Note (Signed)
On metformin 500mg  daily.  Compliant.  No side effects. A1c is 5.8.   - continue taking metformin.

## 2019-01-23 NOTE — Assessment & Plan Note (Signed)
Likely osteoarthritis given hx of trauma, and excess wear and tear.  Worse as day progresses and worse after exertion/use.  There is a tender area of swelling in the medial side of the patella possibly superior to the joint line.  May represent bursitis, or just effusion related to arthritis.  Patient on meloxicam already.  Added tylenol.  Initially prescribed ibuprofen but d/c'd it after pt stated he was taking meloxicam already. Ordered xrays of knee to evaluate for arthritis. Recommended use of cane.  Advised patient to make f/u appt with pcp in 4-6 weeks for ongoing treatment/workup

## 2019-01-23 NOTE — Telephone Encounter (Signed)
Covid-19 screening questions   Do you now or have you had a fever in the last 14 days?  Do you have any respiratory symptoms of shortness of breath or cough now or in the last 14 days?  Do you have any family members or close contacts with diagnosed or suspected Covid-19 in the past 14 days?  Have you been tested for Covid-19 and found to be positive?       

## 2019-01-24 ENCOUNTER — Encounter: Payer: Medicare HMO | Admitting: Gastroenterology

## 2019-01-24 ENCOUNTER — Telehealth: Payer: Self-pay | Admitting: Gastroenterology

## 2019-01-24 NOTE — Telephone Encounter (Signed)
Physician aware that patient cancelled appointment.   Riki Sheer, LPN ( Admitting )

## 2019-02-13 ENCOUNTER — Ambulatory Visit (INDEPENDENT_AMBULATORY_CARE_PROVIDER_SITE_OTHER): Payer: Medicare HMO | Admitting: Family Medicine

## 2019-02-13 ENCOUNTER — Other Ambulatory Visit: Payer: Self-pay

## 2019-02-13 DIAGNOSIS — M25562 Pain in left knee: Secondary | ICD-10-CM | POA: Diagnosis not present

## 2019-02-13 DIAGNOSIS — Z23 Encounter for immunization: Secondary | ICD-10-CM

## 2019-02-13 DIAGNOSIS — N529 Male erectile dysfunction, unspecified: Secondary | ICD-10-CM

## 2019-02-13 MED ORDER — SILDENAFIL CITRATE 100 MG PO TABS: ORAL_TABLET | ORAL | 0 refills | Status: DC

## 2019-02-13 MED ORDER — MELOXICAM 15 MG PO TABS: 15.0000 mg | ORAL_TABLET | Freq: Every day | ORAL | 3 refills | Status: DC

## 2019-02-13 NOTE — Patient Instructions (Signed)
It looks like you most likely have an exacerbation of arthritis in your knee.  In the short-term, you can start taking Mobic daily.  We will try to get you scheduled as soon as we can for an appointment for a steroid injection here in the clinic.  I am sorry that this will require you waiting another week or 2 before getting an injection.  Following her injection, I think it would be a good idea to significantly decrease your Mobic and transition back to ibuprofen.

## 2019-02-15 ENCOUNTER — Encounter: Payer: Self-pay | Admitting: Family Medicine

## 2019-02-15 NOTE — Assessment & Plan Note (Addendum)
Differential includes gout, cellulitis, osteoarthritis, bursitis.  Physical exam shows new bony formation moderate tenderness most consistent with osteoarthritis.  No exquisite tenderness noted note history significant for gout previously.  No fevers, nausea, vomiting, evidence of infection that would suggest a cellulitis.  Overall, most consistent with osteoarthritis of the left knee.  Discussed pain control with NSAIDs.  Also offered steroid injection at subsequent visit.  Visit has been arranged with Dr. Grandville Silos for left knee injection.  The adverse effects of steroid injection completed briefly discussed and Mr. Mark Cook agreed that he would like to move forward with an injection. -Mobic daily -Encouraged to complete knee x-[place] -Follow-up for steroid injection
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
## 2019-02-20 ENCOUNTER — Ambulatory Visit
Admission: RE | Admit: 2019-02-20 | Discharge: 2019-02-20 | Disposition: A | Discharge status: Home / Self Care | Payer: Medicare HMO | Source: Ambulatory Visit | Attending: Family Medicine | Admitting: Family Medicine

## 2019-02-20 DIAGNOSIS — S79911A Unspecified injury of right hip, initial encounter: Secondary | ICD-10-CM | POA: Diagnosis not present

## 2019-02-20 DIAGNOSIS — G8929 Other chronic pain: Secondary | ICD-10-CM

## 2019-02-20 DIAGNOSIS — M25562 Pain in left knee: Secondary | ICD-10-CM

## 2019-02-20 DIAGNOSIS — M25551 Pain in right hip: Secondary | ICD-10-CM | POA: Diagnosis not present

## 2019-02-20 DIAGNOSIS — M1712 Unilateral primary osteoarthritis, left knee: Secondary | ICD-10-CM | POA: Diagnosis not present

## 2019-02-20 NOTE — Addendum Note (Signed)
Addended by: Christen Bame D on: 02/20/2019 03:13 PM   Modules accepted: Orders

## 2019-02-21 ENCOUNTER — Encounter: Payer: Self-pay | Admitting: Family Medicine

## 2019-02-21 ENCOUNTER — Ambulatory Visit (INDEPENDENT_AMBULATORY_CARE_PROVIDER_SITE_OTHER): Payer: Medicare HMO | Admitting: Family Medicine

## 2019-02-21 ENCOUNTER — Other Ambulatory Visit: Payer: Self-pay

## 2019-02-21 DIAGNOSIS — M25562 Pain in left knee: Secondary | ICD-10-CM

## 2019-02-21 MED ORDER — LIDOCAINE 1%/NA BICARB 0.1 MEQ INJECTION: 4.0000 mL | INJECTION | Freq: Once | INTRAVENOUS | Status: DC

## 2019-02-21 MED ORDER — METHYLPREDNISOLONE ACETATE 40 MG/ML IJ SUSP
40.0000 mg | Freq: Once | INTRAMUSCULAR | Status: AC
  Administered 2019-02-21: 40 mg via INTRAMUSCULAR

## 2019-02-21 MED ORDER — SODIUM CHLORIDE 0.9 % IV SOLN: 40.0000 mg | Freq: Once | INTRAVENOUS | Status: DC
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
## 2019-02-21 NOTE — Patient Instructions (Signed)
Knee Injection A knee injection is a procedure to get medicine into your knee joint to relieve the pain, swelling, and stiffness of arthritis. Your health care provider uses a needle to inject medicine, which may also help to lubricate and cushion your knee joint. You may need more than one injection. Tell a health care provider about:  Any allergies you have.  All medicines you are taking, including vitamins, herbs, eye drops, creams, and over-the-counter medicines.  Any problems you or family members have had with anesthetic medicines.  Any blood disorders you have.  Any surgeries you have had.  Any medical conditions you have.  Whether you are pregnant or may be pregnant. What are the risks? Generally, this is a safe procedure. However, problems may occur, including:  Infection.  Bleeding.  Symptoms that get worse.  Damage to the area around your knee.  Allergic reaction to any of the medicines.  Skin reactions from repeated injections. What happens before the procedure?  Ask your health care provider about changing or stopping your regular medicines. This is especially important if you are taking diabetes medicines or blood thinners.  Plan to have someone take you home from the hospital or clinic. What happens during the procedure?   You will sit or lie down in a position for your knee to be treated.  The skin over your kneecap will be cleaned with a germ-killing soap.  You will be given a medicine that numbs the area (local anesthetic). You may feel some stinging.  The medicine will be injected into your knee. The needle is carefully placed between your kneecap and your knee. The medicine is injected into the joint space.  The needle will be removed at the end of the procedure.  A bandage (dressing) may be placed over the injection site. The procedure may vary among health care providers and hospitals. What can I expect after the procedure?  Your blood  pressure, heart rate, breathing rate, and blood oxygen level will be monitored until you leave the hospital or clinic.  You may have to move your knee through its full range of motion. This helps to get all the medicine into your joint space.  You will be watched to make sure that you do not have a reaction to the injected medicine.  You may feel more pain, swelling, and warmth than you did before the injection. This reaction may last about 1-2 days. Follow these instructions at home: Medicines  Take over-the-counter and prescription medicines only as told by your doctor.  Do not drive or use heavy machinery while taking prescription pain medicine.  Do not take medicines such as aspirin and ibuprofen unless your health care provider tells you to take them. Injection site care  Follow instructions from your health care provider about: ? How to take care of your puncture site. ? When and how you should change your dressing. ? When you should remove your dressing.  Check your injection area every day for signs of infection. Check for: ? More redness, swelling, or pain after 2 days. ? Fluid or blood. ? Pus or a bad smell. ? Warmth. Managing pain, stiffness, and swelling   If directed, put ice on the injection area: ? Put ice in a plastic bag. ? Place a towel between your skin and the bag. ? Leave the ice on for 20 minutes, 2-3 times per day.  Do not apply heat to your knee.  Raise (elevate) the injection area above the level   of your heart while you are sitting or lying down. General instructions  If you were given a dressing, keep it dry until your health care provider says it can be removed. Ask your health care provider when you can start showering or taking a bath.  Avoid strenuous activities for as long as directed by your health care provider. Ask your health care provider when you can return to your normal activities.  Keep all follow-up visits as told by your health  care provider. This is important. You may need more injections. Contact a health care provider if you have:  A fever.  Warmth in your injection area.  Fluid, blood, or pus coming from your injection site.  Symptoms at your injection site that last longer than 2 days after your procedure. Get help right away if:  Your knee: ? Turns very red. ? Becomes very swollen. ? Is in severe pain. Summary  A knee injection is a procedure to get medicine into your knee joint to relieve the pain, swelling, and stiffness of arthritis.  A needle is carefully placed between your kneecap and your knee to inject medicine into the joint space.  Before the procedure, ask your health care provider about changing or stopping your regular medicines, especially if you are taking diabetes medicines or blood thinners.  Contact your health care provider if you have any problems or questions after your procedure. This information is not intended to replace advice given to you by your health care provider. Make sure you discuss any questions you have with your health care provider. Document Released: 07/18/2006 Document Revised: 05/16/2017 Document Reviewed: 05/16/2017 Elsevier Patient Education  2020 [employer]  

## 2019-02-23 ENCOUNTER — Other Ambulatory Visit: Payer: Self-pay | Admitting: Family Medicine

## 2019-02-23 DIAGNOSIS — M25562 Pain in left knee: Secondary | ICD-10-CM

## 2019-02-23 DIAGNOSIS — M545 Low back pain, unspecified: Secondary | ICD-10-CM

## 2019-02-23 DIAGNOSIS — M170 Bilateral primary osteoarthritis of knee: Secondary | ICD-10-CM

## 2019-02-23 DIAGNOSIS — G8929 Other chronic pain: Secondary | ICD-10-CM

## 2019-04-04 ENCOUNTER — Other Ambulatory Visit: Payer: Self-pay

## 2019-04-04 ENCOUNTER — Encounter: Payer: Self-pay | Admitting: Family Medicine

## 2019-04-04 ENCOUNTER — Ambulatory Visit (INDEPENDENT_AMBULATORY_CARE_PROVIDER_SITE_OTHER): Payer: Medicare HMO | Admitting: Family Medicine

## 2019-04-04 VITALS — BP 122/70 | HR 68 | Ht 76.0 in | Wt 245.0 lb

## 2019-04-04 DIAGNOSIS — R109 Unspecified abdominal pain: Secondary | ICD-10-CM | POA: Diagnosis not present

## 2019-04-04 DIAGNOSIS — R351 Nocturia: Secondary | ICD-10-CM | POA: Diagnosis not present

## 2019-04-04 DIAGNOSIS — N410 Acute prostatitis: Secondary | ICD-10-CM | POA: Diagnosis not present

## 2019-04-04 LAB — POCT URINALYSIS DIP (MANUAL ENTRY)
Bilirubin, UA: NEGATIVE
Blood, UA: NEGATIVE
Glucose, UA: NEGATIVE mg/dL
Ketones, POC UA: NEGATIVE mg/dL
Leukocytes, UA: NEGATIVE
Nitrite, UA: NEGATIVE
Protein Ur, POC: NEGATIVE mg/dL
Spec Grav, UA: 1.025 (ref 1.010–1.025)
Urobilinogen, UA: 0.2 E.U./dL
pH, UA: 5.5 (ref 5.0–8.0)

## 2019-04-04 MED ORDER — TAMSULOSIN HCL 0.4 MG PO CAPS: 0.4000 mg | ORAL_CAPSULE | Freq: Every day | ORAL | 3 refills | Status: DC

## 2019-04-04 MED ORDER — CIPROFLOXACIN HCL 500 MG PO TABS: 500.0000 mg | ORAL_TABLET | Freq: Two times a day (BID) | ORAL | 0 refills | Status: AC

## 2019-04-04 NOTE — Patient Instructions (Signed)
Mr. Mark Cook,  It was lovely to meet you today!! I am treating you for a presumed infection of your prostate with a 3-week course of antibiotics.  Please come back to see [place] if your symptoms have not improved.  I have also started you on a medication called tamsulosin which will help with emptying her bladder and which should help with the symptoms of passing urine at night.  Please follow-up with your regular PCP.  Best wishes  Dr. Posey Pronto

## 2019-04-04 NOTE — Progress Notes (Signed)
   Subjective:    Patient ID: Mark Cook, male    DOB: 1955-11-06, 63 y.o.   MRN: [id number]   CC: Mark Cook is a 63 year old male who presents today for abdominal pain   HPI:   Abdominal pain  Patient has had "bladder infection" in the past and feels like he has similar symptoms now. 1.5 weeks hx of dull, non radiating suprapubic and lower pelvic pain and gradual onset. Has hx of renal colic but this does not feel similar to those episodes. 6/10 severity. Tried tylenol at home which has not helped. Denies hematuria, passage of clots, dysuria but does report cloudy urine with nocturia. Nocturia has been long standing which pt attributes to Metformin use. Denies back pain, fevers, malaise, change in bowel habit or testicular pain.   Smoking status reviewed   ROS: pertinent noted in the HPI    Past medical history, surgical, family, and social history reviewed and updated in the EMR as appropriate. Reviewed problem list.   Objective:  BP 122/70   Pulse 68   Ht 6\' 4"  (1.93 m)   Wt 245 lb (111.1 kg)   SpO2 96%   BMI 29.82 kg/m   Vitals and nursing note reviewed  General: NAD, pleasant, able to participate in exam Cardiac: RRR, S1 S2 present. normal heart sounds, no murmurs. Respiratory: CTAB, normal effort, No wheezes, rales or rhonchi GI: Abdo soft, lower pelvic tenderness, no guarding, bowel sounds present. No abdominal distension  Extremities: no edema or cyanosis. Skin: warm and dry, no rashes noted Neuro: alert, no obvious focal deficits Psych: Normal affect and mood  Rectal exam chaperoned by CMA Tashira No external hemorrhoids or anal fissures Tolerated with minimal tenderness Mild tenderness of prostate No rectal masses felt  Assessment & Plan:   Nocturia more than twice per night Nocturia 3-4 times a night likely 2/2 BPH. -Start Tamsulosin 0.4mg   -F/U with PCP Dr Pilar Plate   Prostatitis, acute Considered UTI as most obvious cause of sx but unlikely as UA negative.  Renal colic unlikely as pain is not colicky, severe enough and does not feel like usual renal colic pain.  Will treat as Prostatitis given pt havingsome outflow tract sx-nocturia which could be precipitating an infection.  -Ciprofloxacin 500mg  BID 3 weeks. -PCP F/U if no resolution of sx.   Mark Haw, MD  [facility] PGY-1

## 2019-04-04 NOTE — Assessment & Plan Note (Signed)
Nocturia 3-4 times a night likely 2/2 BPH. -Start Tamsulosin 0.4mg   -F/U with PCP Dr Pilar Plate

## 2019-04-06 DIAGNOSIS — N410 Acute prostatitis: Secondary | ICD-10-CM

## 2019-04-06 HISTORY — DX: Acute prostatitis: N41.0

## 2019-04-06 NOTE — Assessment & Plan Note (Signed)
Considered UTI as most obvious cause of sx but unlikely as UA negative. Renal colic unlikely as pain is not colicky, severe enough and does not feel like usual renal colic pain.  Will treat as Prostatitis given pt havingsome outflow tract sx-nocturia which could be precipitating an infection.  -Ciprofloxacin 500mg  BID 3 weeks. -PCP F/U if no resolution of sx.

## 2019-04-09 ENCOUNTER — Ambulatory Visit: Payer: Medicare HMO

## 2019-04-10 ENCOUNTER — Ambulatory Visit: Payer: Medicare HMO | Attending: Family Medicine

## 2019-04-10 ENCOUNTER — Other Ambulatory Visit: Payer: Self-pay

## 2019-04-10 DIAGNOSIS — M545 Low back pain: Secondary | ICD-10-CM | POA: Insufficient documentation

## 2019-04-10 DIAGNOSIS — M25562 Pain in left knee: Secondary | ICD-10-CM | POA: Diagnosis not present

## 2019-04-10 DIAGNOSIS — R262 Difficulty in walking, not elsewhere classified: Secondary | ICD-10-CM | POA: Diagnosis not present

## 2019-04-10 DIAGNOSIS — G8929 Other chronic pain: Secondary | ICD-10-CM | POA: Diagnosis not present

## 2019-04-10 DIAGNOSIS — M25561 Pain in right knee: Secondary | ICD-10-CM | POA: Diagnosis not present
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
## 2019-04-12 ENCOUNTER — Ambulatory Visit: Payer: Medicare HMO | Admitting: Physical Therapy

## 2019-04-12 ENCOUNTER — Other Ambulatory Visit: Payer: Self-pay

## 2019-04-12 ENCOUNTER — Encounter: Payer: Self-pay | Admitting: Physical Therapy

## 2019-04-12 DIAGNOSIS — M25562 Pain in left knee: Secondary | ICD-10-CM

## 2019-04-12 DIAGNOSIS — G8929 Other chronic pain: Secondary | ICD-10-CM

## 2019-04-12 DIAGNOSIS — R262 Difficulty in walking, not elsewhere classified: Secondary | ICD-10-CM

## 2019-04-12 DIAGNOSIS — M545 Low back pain: Secondary | ICD-10-CM | POA: Diagnosis not present

## 2019-04-12 DIAGNOSIS — M25561 Pain in right knee: Secondary | ICD-10-CM | POA: Diagnosis not present
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
## 2019-04-17 ENCOUNTER — Ambulatory Visit: Payer: Medicare HMO

## 2019-04-19 ENCOUNTER — Other Ambulatory Visit: Payer: Self-pay

## 2019-04-19 ENCOUNTER — Ambulatory Visit: Payer: Medicare HMO | Admitting: Physical Therapy

## 2019-04-19 ENCOUNTER — Other Ambulatory Visit: Payer: Self-pay | Admitting: Family Medicine

## 2019-04-19 DIAGNOSIS — M25561 Pain in right knee: Secondary | ICD-10-CM | POA: Diagnosis not present

## 2019-04-19 DIAGNOSIS — R262 Difficulty in walking, not elsewhere classified: Secondary | ICD-10-CM

## 2019-04-19 DIAGNOSIS — M25562 Pain in left knee: Secondary | ICD-10-CM | POA: Diagnosis not present

## 2019-04-19 DIAGNOSIS — G8929 Other chronic pain: Secondary | ICD-10-CM

## 2019-04-19 DIAGNOSIS — M545 Low back pain: Secondary | ICD-10-CM
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
## 2019-04-24 ENCOUNTER — Ambulatory Visit: Payer: Medicare HMO | Admitting: Physical Therapy

## 2019-04-26 ENCOUNTER — Ambulatory Visit: Payer: Medicare HMO

## 2019-05-01 ENCOUNTER — Ambulatory Visit: Payer: Medicare HMO

## 2019-05-01 ENCOUNTER — Telehealth: Payer: Self-pay | Admitting: Physical Therapy

## 2019-05-01 NOTE — Telephone Encounter (Signed)
Mr. Mark Cook was contacted at home and he reports he forgot appointment and would be here for the next appointment on 12/24 at 8:30 AM

## 2019-05-03 ENCOUNTER — Ambulatory Visit: Payer: Medicare HMO

## 2019-05-03 ENCOUNTER — Telehealth: Payer: Self-pay | Admitting: Physical Therapy

## 2019-05-03 NOTE — Telephone Encounter (Signed)
The phone was answered at the home of Mark Cook and when I stated my name and was calling from [facility] PT the phone was hung up.

## 2019-05-08 ENCOUNTER — Ambulatory Visit: Payer: Medicare HMO | Admitting: Physical Therapy

## 2019-05-08 ENCOUNTER — Encounter: Payer: Self-pay | Admitting: Physical Therapy

## 2019-05-08 ENCOUNTER — Other Ambulatory Visit: Payer: Self-pay

## 2019-05-08 DIAGNOSIS — M545 Low back pain, unspecified: Secondary | ICD-10-CM

## 2019-05-08 DIAGNOSIS — M25561 Pain in right knee: Secondary | ICD-10-CM | POA: Diagnosis not present

## 2019-05-08 DIAGNOSIS — R262 Difficulty in walking, not elsewhere classified: Secondary | ICD-10-CM | POA: Diagnosis not present

## 2019-05-08 DIAGNOSIS — G8929 Other chronic pain: Secondary | ICD-10-CM | POA: Diagnosis not present

## 2019-05-08 DIAGNOSIS — M25562 Pain in left knee: Secondary | ICD-10-CM | POA: Diagnosis not present
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
## 2019-05-10 ENCOUNTER — Ambulatory Visit: Payer: Medicare HMO | Admitting: Physical Therapy

## 2019-05-11 DIAGNOSIS — U071 COVID-19: Secondary | ICD-10-CM

## 2019-05-11 HISTORY — DX: COVID-19: U07.1

## 2019-05-15 ENCOUNTER — Ambulatory Visit: Payer: Medicare HMO | Attending: Family Medicine

## 2019-05-17 ENCOUNTER — Ambulatory Visit: Payer: Medicare HMO

## 2019-05-17 ENCOUNTER — Other Ambulatory Visit: Payer: Self-pay | Admitting: Family Medicine

## 2019-05-17 DIAGNOSIS — N529 Male erectile dysfunction, unspecified: Secondary | ICD-10-CM

## 2019-05-22 ENCOUNTER — Ambulatory Visit: Payer: Medicare HMO | Admitting: Physical Therapy

## 2019-05-24 ENCOUNTER — Ambulatory Visit: Payer: Medicare HMO | Admitting: Physical Therapy

## 2019-05-29 ENCOUNTER — Ambulatory Visit: Payer: Medicare HMO

## 2019-05-31 ENCOUNTER — Ambulatory Visit: Payer: Medicare HMO | Admitting: Physical Therapy

## 2019-06-07 ENCOUNTER — Encounter: Payer: Medicare HMO | Admitting: Physical Therapy

## 2019-07-04 ENCOUNTER — Other Ambulatory Visit: Payer: Self-pay | Admitting: Family Medicine

## 2019-10-27 ENCOUNTER — Other Ambulatory Visit: Payer: Self-pay | Admitting: Family Medicine

## 2019-10-27 DIAGNOSIS — N529 Male erectile dysfunction, unspecified: Secondary | ICD-10-CM

## 2019-12-03 ENCOUNTER — Other Ambulatory Visit: Payer: Self-pay | Admitting: Family Medicine

## 2020-03-05 ENCOUNTER — Other Ambulatory Visit: Payer: Self-pay

## 2020-03-05 DIAGNOSIS — N529 Male erectile dysfunction, unspecified: Secondary | ICD-10-CM

## 2020-03-07 MED ORDER — SILDENAFIL CITRATE 100 MG PO TABS: ORAL_TABLET | ORAL | 4 refills | Status: DC

## 2020-08-01 ENCOUNTER — Other Ambulatory Visit: Payer: Self-pay

## 2020-08-01 DIAGNOSIS — N529 Male erectile dysfunction, unspecified: Secondary | ICD-10-CM

## 2020-08-01 MED ORDER — SILDENAFIL CITRATE 100 MG PO TABS: ORAL_TABLET | ORAL | 4 refills | Status: DC

## 2020-08-15 ENCOUNTER — Emergency Department (HOSPITAL_BASED_OUTPATIENT_CLINIC_OR_DEPARTMENT_OTHER): Payer: Medicare HMO | Admitting: Radiology

## 2020-08-15 ENCOUNTER — Emergency Department (HOSPITAL_BASED_OUTPATIENT_CLINIC_OR_DEPARTMENT_OTHER)
Admission: EM | Admit: 2020-08-15 | Discharge: 2020-08-15 | Disposition: A | Discharge status: Home / Self Care | Payer: Medicare HMO | Attending: Emergency Medicine | Admitting: Emergency Medicine

## 2020-08-15 ENCOUNTER — Encounter (HOSPITAL_BASED_OUTPATIENT_CLINIC_OR_DEPARTMENT_OTHER): Payer: Self-pay

## 2020-08-15 ENCOUNTER — Encounter (HOSPITAL_COMMUNITY): Payer: Self-pay

## 2020-08-15 ENCOUNTER — Other Ambulatory Visit: Payer: Self-pay

## 2020-08-15 ENCOUNTER — Ambulatory Visit (HOSPITAL_COMMUNITY)
Admission: EM | Admit: 2020-08-15 | Discharge: 2020-08-15 | Disposition: A | Discharge status: Home / Self Care | Payer: Medicare HMO

## 2020-08-15 DIAGNOSIS — R0789 Other chest pain: Secondary | ICD-10-CM | POA: Diagnosis not present

## 2020-08-15 DIAGNOSIS — E1142 Type 2 diabetes mellitus with diabetic polyneuropathy: Secondary | ICD-10-CM | POA: Insufficient documentation

## 2020-08-15 DIAGNOSIS — R059 Cough, unspecified: Secondary | ICD-10-CM | POA: Insufficient documentation

## 2020-08-15 DIAGNOSIS — Z87891 Personal history of nicotine dependence: Secondary | ICD-10-CM | POA: Diagnosis not present

## 2020-08-15 DIAGNOSIS — M79602 Pain in left arm: Secondary | ICD-10-CM | POA: Insufficient documentation

## 2020-08-15 DIAGNOSIS — R0602 Shortness of breath: Secondary | ICD-10-CM | POA: Insufficient documentation

## 2020-08-15 DIAGNOSIS — Z7984 Long term (current) use of oral hypoglycemic drugs: Secondary | ICD-10-CM | POA: Insufficient documentation

## 2020-08-15 DIAGNOSIS — R079 Chest pain, unspecified: Secondary | ICD-10-CM | POA: Diagnosis not present

## 2020-08-15 DIAGNOSIS — G8929 Other chronic pain: Secondary | ICD-10-CM | POA: Diagnosis not present

## 2020-08-15 DIAGNOSIS — M542 Cervicalgia: Secondary | ICD-10-CM | POA: Diagnosis not present

## 2020-08-15 HISTORY — DX: Cardiomegaly: I51.7

## 2020-08-15 LAB — CBC
HCT: 39.7 % (ref 39.0–52.0)
Hemoglobin: 13.8 g/dL (ref 13.0–17.0)
MCH: 31 pg (ref 26.0–34.0)
MCHC: 34.8 g/dL (ref 30.0–36.0)
MCV: 89.2 fL (ref 80.0–100.0)
Platelets: 159 10*3/uL (ref 150–400)
RBC: 4.45 MIL/uL (ref 4.22–5.81)
RDW: 13.5 % (ref 11.5–15.5)
WBC: 5.4 10*3/uL (ref 4.0–10.5)
nRBC: 0 % (ref 0.0–0.2)

## 2020-08-15 LAB — BASIC METABOLIC PANEL
Anion gap: 11 (ref 5–15)
BUN: 21 mg/dL (ref 8–23)
CO2: 21 mmol/L — ABNORMAL LOW (ref 22–32)
Calcium: 9.1 mg/dL (ref 8.9–10.3)
Chloride: 106 mmol/L (ref 98–111)
Creatinine, Ser: 1.2 mg/dL (ref 0.61–1.24)
GFR, Estimated: >60 mL/min (ref >60)
Glucose, Bld: 99 mg/dL (ref 70–99)
Potassium: 4.5 mmol/L (ref 3.5–5.1)
Sodium: 138 mmol/L (ref 135–145)

## 2020-08-15 LAB — TROPONIN I (HIGH SENSITIVITY)
Troponin I (High Sensitivity): 9 ng/L (ref <18)
Troponin I (High Sensitivity): 10 ng/L (ref <18)

## 2020-08-15 NOTE — ED Triage Notes (Signed)
Chest tightness and left sided numbness , intermittent ongoing for last 2 months, worse last 3 days.

## 2020-08-15 NOTE — ED Notes (Signed)
Patient ambulated great during hallway. No difficulty. Rates numbness of left arm 4/10.

## 2020-08-15 NOTE — ED Notes (Signed)
Pt given Diet Ginger Ale, per Zoe - RN.

## 2020-08-15 NOTE — ED Triage Notes (Signed)
Left side (arm, leg, chest) numbness and tingling with chest pain on and off for a couple months, worsening past 3 days.
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
## 2020-08-15 NOTE — ED Notes (Addendum)
Patient is being discharged from the Urgent Care and sent to the Emergency Department via POV . Per Roosvelt Maser, PA,  patient is in need of higher level of care due to Chest pain and left side numbness and tingling. Maurice March PA spoke with pt about importance of getting a ride to ED to be evaluated immediately for safety, rather than driving himself. Patient is aware and verbalizes understanding of plan of care.  Vitals:   08/15/20 1715  BP: 115/60  Pulse: 73  Resp: 18  Temp: 97.8 F (36.6 C)  SpO2: 97%

## 2020-09-07 NOTE — Progress Notes (Signed)
Erroneous encounter. Patient scheduled for later appt.   Peggyann Shoals, DO [facility], PGY-3 09/08/2020 6:16 PM

## 2020-09-08 ENCOUNTER — Other Ambulatory Visit: Payer: Self-pay

## 2020-09-08 ENCOUNTER — Ambulatory Visit (INDEPENDENT_AMBULATORY_CARE_PROVIDER_SITE_OTHER): Payer: Medicare HMO | Admitting: Family Medicine

## 2020-09-08 ENCOUNTER — Encounter: Payer: Self-pay | Admitting: Family Medicine

## 2020-09-08 VITALS — BP 130/64 | HR 67 | Ht 76.0 in | Wt 250.5 lb

## 2020-09-08 DIAGNOSIS — Z1211 Encounter for screening for malignant neoplasm of colon: Secondary | ICD-10-CM | POA: Diagnosis not present

## 2020-09-08 DIAGNOSIS — E119 Type 2 diabetes mellitus without complications: Secondary | ICD-10-CM

## 2020-09-08 DIAGNOSIS — Z1159 Encounter for screening for other viral diseases: Secondary | ICD-10-CM | POA: Diagnosis not present

## 2020-09-08 DIAGNOSIS — E785 Hyperlipidemia, unspecified: Secondary | ICD-10-CM | POA: Diagnosis not present

## 2020-09-08 DIAGNOSIS — E1142 Type 2 diabetes mellitus with diabetic polyneuropathy: Secondary | ICD-10-CM

## 2020-09-08 LAB — POCT GLYCOSYLATED HEMOGLOBIN (HGB A1C): HbA1c, POC (controlled diabetic range): 5.7 % (ref 0.0–7.0)

## 2020-09-08 MED ORDER — METFORMIN HCL 500 MG PO TABS: 1.0000 | ORAL_TABLET | Freq: Every day | ORAL | 3 refills | Status: DC

## 2020-09-08 MED ORDER — ATORVASTATIN CALCIUM 20 MG PO TABS
20.0000 mg | ORAL_TABLET | Freq: Every day | ORAL | 3 refills | Status: DC
Start: 2020-09-08 — End: 2021-01-22

## 2020-09-08 NOTE — Assessment & Plan Note (Signed)
Patient's diabetes very well controlled, A1c today 5.7%. -Patient can continue metformin 500 mg once daily instead of twice daily -Ordered BMP -Patient started on statin Lipitor 20 mg daily due to elevated ASCVD risk related to diabetes -Patient's systolic BP today slightly elevated at 130, could consider starting patient on ACE/ARB.  This was not done at today's visit as some any other changes had already been made. -Patient to continue lifestyle modifications

## 2020-09-08 NOTE — Assessment & Plan Note (Signed)
Patient with history of dyslipidemia, slightly decreased HDL at 36, triglycerides slightly elevated at 199.  Patient not currently on a statin.  ASCVD risk elevated at 22%.  Patient was previous smoker, quit 7 years ago. -Patient started on Lipitor 20 mg daily at today's visit.  Ideally patient would benefit from moderate-high intensity statin; wanted to start Lipitor 20 mg daily to assess for tolerance. -Recommend patient follow-up in 1-2 months to assess tolerance of Lipitor and to consider increasing dose up to 40-80

## 2020-09-08 NOTE — Progress Notes (Signed)
SUBJECTIVE:    CHIEF COMPLAINT / HPI:   T2DM  A1c check: A1c in Sept 2020 was 5.8%. Has previously been as high as 9.6%. Patient currently prescribed Metformin 500mg  twice daily. Is not on ACE/ARB or Statin.  ASCVD risk was discussed with patient, was calculated to be about 22%.  Patient agrees to start statin at today's visit.  We will also check BMP and urine creatinine/microalbumin ratio.  Dyslipidemia: Patient previously with decreased HDL and slightly elevated triglycerides to from 3 years prior.  Patient not currently prescribed a statin but agrees to starting one today due to his elevated ASCVD risk.  Colon cancer screening: Patient has never had colonoscopy before.  Is interested in pursuing Cologuard.  Patient was informed that if Cologuard comes back with abnormal result the patient will have to have colonoscopy, anyways.  Patient voices understanding.  Health maintenance: Patient still due for eye exam, COVID-vaccine.  Follow-up colonoscopy. Patient had following preventative healthcare measures performed/assessed at today's visit: Hep C Screen, Lipid Panel, A1c, Foot Exam, urine microalbumin, and colon cancer screening.   PERTINENT  PMH / PSH:  Patient Active Problem List   Diagnosis Date Noted  . Screen for colon cancer 09/08/2020  . Encounter for hepatitis C screening test for low risk patient 09/08/2020  . Hyperlipidemia 09/08/2020  . Prostatitis, acute 04/06/2019  . Nocturia more than twice per night 04/04/2019  . Knee pain 01/23/2019  . Hip pain 11/14/2018  . Encounter for colorectal cancer screening 11/14/2018  . Healthcare maintenance 01/20/2017  . Status post amputation of lesser toe of left foot ([facility]) 11/15/2016  . Subacute osteomyelitis of left foot (H[facility]) 11/02/2016  . Diabetic polyneuropathy associated with type 2 diabetes mellitus (HCC) 11/02/2016  . Necrosis of toe (HCC) 08/09/2016  . Unprotected sexual intercourse 12/03/2015  . Dysuria 12/03/2015  .  Left low back pain 12/03/2015  . Elevated blood pressure 12/03/2015  . Insect bite 10/16/2015  . Lower abdominal pain 10/02/2014  . Nodule of left lung 10/02/2014  . Hematuria 02/14/2014  . Erectile dysfunction 09/03/2013  . Type 2 diabetes mellitus (HCC) 02/15/2013  . Rib pain on right side 02/15/2013     OBJECTIVE:   BP 130/64   Pulse 67   Ht 6\' 4"  (1.93 m)   Wt 250 lb 8 oz (113.6 kg)   SpO2 97%   BMI 30.49 kg/m    Physical exam: General: Well-appearing patient, very pleasant, no acute distress Respiratory: Comfortable work of breathing on room air Foot exam: Performed at today's visit  Diabetic foot exam was performed with the following findings:   Normal sensation of 10g monofilament Intact posterior tibialis and dorsalis pedis pulses Patient with mild callus appreciated to plantar aspect of right third digit at distal end.  Patient with small "blood blister" appreciated to distal end of right great toe measures approximately 3 x 3 mm.  Onychomycosis appreciated to patient's right great toenail, nail of third and fourth digits.  5 toes on patient's right foot, 4 toes on patient's left foot (patient is missing the second digit), flat callus appreciated to patient's right foot plantar aspect of the first MTP.  Onychomycosis appreciated to first and third digit of patient's left foot.     ASSESSMENT/PLAN:   Type 2 diabetes mellitus (HCC) Patient's diabetes very well controlled, A1c today 5.7%. -Patient can continue metformin 500 mg once daily instead of twice daily -Ordered BMP -Patient started on statin Lipitor 20 mg daily due to elevated ASCVD  risk related to diabetes -Patient's systolic BP today slightly elevated at 130, could consider starting patient on ACE/ARB.  This was not done at today's visit as some any other changes had already been made. -Patient to continue lifestyle modifications  Encounter for hepatitis C screening test for low risk patient -Patient  screened for hepatitis C antibody at today's visit  Hyperlipidemia Patient with history of dyslipidemia, slightly decreased HDL at 36, triglycerides slightly elevated at 199.  Patient not currently on a statin.  ASCVD risk elevated at 22%.  Patient was previous smoker, quit 7 years ago. -Patient started on Lipitor 20 mg daily at today's visit.  Ideally patient would benefit from moderate-high intensity statin; wanted to start Lipitor 20 mg daily to assess for tolerance. -Recommend patient follow-up in 1-2 months to assess tolerance of Lipitor and to consider increasing dose up to 40-80   Screen for colon cancer Patient agreed to screening for colon cancer with Cologuard. -Order placed for Cologuard -Message sent to lab specialist Tessie Fass regarding order   Health maintenance: Patient would benefit from eye exam and COVID-vaccine  Dollene Cleveland, DO [facility]

## 2020-09-08 NOTE — Patient Instructions (Addendum)
Thank you for coming in to see [place] today! Please see below to review our plan for today's visit:  1. We are checking your kidney function, electrolytes, cholesterol levels, A1c, Hepatitis C antibody, and checking urine to assess kidney function.  2. I have prescribed Lipitor/Atorvastatin 20mg  for cholesterol. Please take this daily.    3. We will call you with results. Please come back and see in no longer than 3 months.   You can call Dr. [place] office to ask about foot care for diabetic feet - trimming down calluses, etc.   Please call the clinic at [phone] if your symptoms worsen or you have any concerns. It was our pleasure to serve you!   Dr. [phone] [facility]

## 2020-09-08 NOTE — Assessment & Plan Note (Signed)
Patient agreed to screening for colon cancer with Cologuard. -Order placed for Cologuard -Message sent to lab specialist Tessie Fass regarding order

## 2020-09-08 NOTE — Assessment & Plan Note (Signed)
-  Patient screened for hepatitis C antibody at today's visit

## 2020-09-09 LAB — BASIC METABOLIC PANEL
BUN/Creatinine Ratio: 14 (ref 10–24)
BUN: 15 mg/dL (ref 8–27)
CO2: 23 mmol/L (ref 20–29)
Calcium: 9.3 mg/dL (ref 8.6–10.2)
Chloride: 104 mmol/L (ref 96–106)
Creatinine, Ser: 1.05 mg/dL (ref 0.76–1.27)
Glucose: 80 mg/dL (ref 65–99)
Potassium: 4.4 mmol/L (ref 3.5–5.2)
Sodium: 141 mmol/L (ref 134–144)
eGFR: 79 mL/min/{1.73_m2} (ref >59)

## 2020-09-09 LAB — LIPID PANEL
Chol/HDL Ratio: 3.5 ratio (ref 0.0–5.0)
Cholesterol, Total: 173 mg/dL (ref 100–199)
HDL: 50 mg/dL (ref >39)
LDL Chol Calc (NIH): 91 mg/dL (ref 0–99)
Triglycerides: 189 mg/dL — ABNORMAL HIGH (ref 0–149)
VLDL Cholesterol Cal: 32 mg/dL (ref 5–40)

## 2020-09-09 LAB — HEPATITIS C ANTIBODY: Hep C Virus Ab: 0.2 s/co ratio (ref 0.0–0.9)

## 2020-09-10 LAB — MICROALBUMIN / CREATININE URINE RATIO
Creatinine, Urine: 108.2 mg/dL
Microalb/Creat Ratio: <3 mg/g creat (ref 0–29)
Microalbumin, Urine: <3 ug/mL

## 2020-09-17 ENCOUNTER — Other Ambulatory Visit: Payer: Self-pay | Admitting: Family Medicine

## 2020-09-17 ENCOUNTER — Telehealth: Payer: Self-pay

## 2020-09-17 NOTE — Telephone Encounter (Signed)
Patient returns call to nurse line requesting to speak with Dr. Dareen Piano. Patient reports that he has additional questions regarding lab results.   Forwarding to Dr. Dareen Piano.   Veronda Prude, RN

## 2020-09-27 LAB — COLOGUARD

## 2020-12-29 ENCOUNTER — Other Ambulatory Visit: Payer: Self-pay

## 2020-12-29 DIAGNOSIS — N529 Male erectile dysfunction, unspecified: Secondary | ICD-10-CM

## 2020-12-29 MED ORDER — SILDENAFIL CITRATE 100 MG PO TABS: ORAL_TABLET | ORAL | 1 refills | Status: DC

## 2021-01-20 ENCOUNTER — Encounter (HOSPITAL_BASED_OUTPATIENT_CLINIC_OR_DEPARTMENT_OTHER): Payer: Self-pay | Admitting: Emergency Medicine

## 2021-01-20 ENCOUNTER — Other Ambulatory Visit: Payer: Self-pay

## 2021-01-20 ENCOUNTER — Emergency Department (HOSPITAL_BASED_OUTPATIENT_CLINIC_OR_DEPARTMENT_OTHER): Payer: Medicare HMO | Admitting: Radiology

## 2021-01-20 ENCOUNTER — Observation Stay (HOSPITAL_BASED_OUTPATIENT_CLINIC_OR_DEPARTMENT_OTHER)
Admission: EM | Admit: 2021-01-20 | Discharge: 2021-01-22 | Disposition: A | Discharge status: Home / Self Care | Payer: Medicare HMO | Attending: Family Medicine | Admitting: Family Medicine

## 2021-01-20 DIAGNOSIS — N401 Enlarged prostate with lower urinary tract symptoms: Secondary | ICD-10-CM | POA: Diagnosis not present

## 2021-01-20 DIAGNOSIS — Z79899 Other long term (current) drug therapy: Secondary | ICD-10-CM | POA: Insufficient documentation

## 2021-01-20 DIAGNOSIS — Z87891 Personal history of nicotine dependence: Secondary | ICD-10-CM | POA: Diagnosis not present

## 2021-01-20 DIAGNOSIS — N529 Male erectile dysfunction, unspecified: Secondary | ICD-10-CM | POA: Diagnosis not present

## 2021-01-20 DIAGNOSIS — Z20822 Contact with and (suspected) exposure to covid-19: Secondary | ICD-10-CM | POA: Insufficient documentation

## 2021-01-20 DIAGNOSIS — R0602 Shortness of breath: Secondary | ICD-10-CM | POA: Diagnosis not present

## 2021-01-20 DIAGNOSIS — F1010 Alcohol abuse, uncomplicated: Secondary | ICD-10-CM | POA: Insufficient documentation

## 2021-01-20 DIAGNOSIS — E119 Type 2 diabetes mellitus without complications: Secondary | ICD-10-CM | POA: Diagnosis not present

## 2021-01-20 DIAGNOSIS — Z7984 Long term (current) use of oral hypoglycemic drugs: Secondary | ICD-10-CM | POA: Diagnosis not present

## 2021-01-20 DIAGNOSIS — E785 Hyperlipidemia, unspecified: Secondary | ICD-10-CM | POA: Insufficient documentation

## 2021-01-20 DIAGNOSIS — R351 Nocturia: Secondary | ICD-10-CM | POA: Diagnosis not present

## 2021-01-20 DIAGNOSIS — Z7289 Other problems related to lifestyle: Secondary | ICD-10-CM | POA: Diagnosis not present

## 2021-01-20 DIAGNOSIS — R079 Chest pain, unspecified: Secondary | ICD-10-CM | POA: Diagnosis not present

## 2021-01-20 DIAGNOSIS — I2510 Atherosclerotic heart disease of native coronary artery without angina pectoris: Secondary | ICD-10-CM | POA: Diagnosis not present

## 2021-01-20 DIAGNOSIS — Z23 Encounter for immunization: Secondary | ICD-10-CM | POA: Insufficient documentation

## 2021-01-20 DIAGNOSIS — G4700 Insomnia, unspecified: Secondary | ICD-10-CM | POA: Insufficient documentation

## 2021-01-20 DIAGNOSIS — R931 Abnormal findings on diagnostic imaging of heart and coronary circulation: Secondary | ICD-10-CM | POA: Insufficient documentation

## 2021-01-20 DIAGNOSIS — R0789 Other chest pain: Secondary | ICD-10-CM | POA: Diagnosis not present

## 2021-01-20 LAB — HEPATIC FUNCTION PANEL
ALT: 32 U/L (ref 0–44)
AST: 26 U/L (ref 15–41)
Albumin: 4.3 g/dL (ref 3.5–5.0)
Alkaline Phosphatase: 44 U/L (ref 38–126)
Bilirubin, Direct: 0.1 mg/dL (ref 0.0–0.2)
Indirect Bilirubin: 0.4 mg/dL (ref 0.3–0.9)
Total Bilirubin: 0.5 mg/dL (ref 0.3–1.2)
Total Protein: 6.8 g/dL (ref 6.5–8.1)

## 2021-01-20 LAB — CBC
HCT: 37.7 % — ABNORMAL LOW (ref 39.0–52.0)
Hemoglobin: 13 g/dL (ref 13.0–17.0)
MCH: 30.3 pg (ref 26.0–34.0)
MCHC: 34.5 g/dL (ref 30.0–36.0)
MCV: 87.9 fL (ref 80.0–100.0)
Platelets: 162 10*3/uL (ref 150–400)
RBC: 4.29 MIL/uL (ref 4.22–5.81)
RDW: 13.5 % (ref 11.5–15.5)
WBC: 4.7 10*3/uL (ref 4.0–10.5)
nRBC: 0 % (ref 0.0–0.2)

## 2021-01-20 LAB — TROPONIN I (HIGH SENSITIVITY)
Troponin I (High Sensitivity): 11 ng/L (ref <18)
Troponin I (High Sensitivity): 14 ng/L (ref <18)

## 2021-01-20 LAB — RESP PANEL BY RT-PCR (FLU A&B, COVID) ARPGX2
Influenza A by PCR: NEGATIVE
Influenza B by PCR: NEGATIVE
SARS Coronavirus 2 by RT PCR: NEGATIVE

## 2021-01-20 LAB — BASIC METABOLIC PANEL
Anion gap: 12 (ref 5–15)
BUN: 16 mg/dL (ref 8–23)
CO2: 20 mmol/L — ABNORMAL LOW (ref 22–32)
Calcium: 9.2 mg/dL (ref 8.9–10.3)
Chloride: 106 mmol/L (ref 98–111)
Creatinine, Ser: 1.01 mg/dL (ref 0.61–1.24)
GFR, Estimated: >60 mL/min (ref >60)
Glucose, Bld: 87 mg/dL (ref 70–99)
Potassium: 4.9 mmol/L (ref 3.5–5.1)
Sodium: 138 mmol/L (ref 135–145)

## 2021-01-20 LAB — LIPASE, BLOOD: Lipase: 23 U/L (ref 11–51)

## 2021-01-20 MED ORDER — FOLIC ACID 1 MG PO TABS
1.0000 mg | ORAL_TABLET | Freq: Once | ORAL | Status: AC
  Administered 2021-01-21: 1 mg via ORAL
  Filled 2021-01-20: qty 1

## 2021-01-20 MED ORDER — THIAMINE HCL 100 MG PO TABS
50.0000 mg | ORAL_TABLET | Freq: Once | ORAL | Status: AC
  Administered 2021-01-21: 50 mg via ORAL
  Filled 2021-01-20: qty 1

## 2021-01-20 MED ORDER — ASPIRIN 81 MG PO CHEW
324.0000 mg | CHEWABLE_TABLET | Freq: Once | ORAL | Status: AC
  Administered 2021-01-20: 324 mg via ORAL
  Filled 2021-01-20: qty 4

## 2021-01-20 MED ORDER — FAMOTIDINE IN NACL 20-0.9 MG/50ML-% IV SOLN
20.0000 mg | Freq: Once | INTRAVENOUS | Status: AC
  Administered 2021-01-20: 20 mg via INTRAVENOUS
  Filled 2021-01-20: qty 50

## 2021-01-20 MED ORDER — SODIUM CHLORIDE 0.9 % IV BOLUS
1000.0000 mL | Freq: Once | INTRAVENOUS | Status: AC
  Administered 2021-01-20: 1000 mL via INTRAVENOUS

## 2021-01-20 NOTE — ED Triage Notes (Signed)
L side chest discomfort radiating to R arm with SOB that started this morning.
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
## 2021-01-21 ENCOUNTER — Other Ambulatory Visit: Payer: Self-pay

## 2021-01-21 ENCOUNTER — Observation Stay (HOSPITAL_BASED_OUTPATIENT_CLINIC_OR_DEPARTMENT_OTHER): Payer: Medicare HMO

## 2021-01-21 ENCOUNTER — Encounter (HOSPITAL_COMMUNITY): Payer: Self-pay | Admitting: Internal Medicine

## 2021-01-21 DIAGNOSIS — R351 Nocturia: Secondary | ICD-10-CM | POA: Diagnosis not present

## 2021-01-21 DIAGNOSIS — G4700 Insomnia, unspecified: Secondary | ICD-10-CM | POA: Diagnosis not present

## 2021-01-21 DIAGNOSIS — R0789 Other chest pain: Secondary | ICD-10-CM | POA: Diagnosis not present

## 2021-01-21 DIAGNOSIS — F1010 Alcohol abuse, uncomplicated: Secondary | ICD-10-CM | POA: Diagnosis not present

## 2021-01-21 DIAGNOSIS — Z20822 Contact with and (suspected) exposure to covid-19: Secondary | ICD-10-CM | POA: Diagnosis not present

## 2021-01-21 DIAGNOSIS — Z7984 Long term (current) use of oral hypoglycemic drugs: Secondary | ICD-10-CM | POA: Diagnosis not present

## 2021-01-21 DIAGNOSIS — Z89422 Acquired absence of other left toe(s): Secondary | ICD-10-CM | POA: Diagnosis not present

## 2021-01-21 DIAGNOSIS — N401 Enlarged prostate with lower urinary tract symptoms: Secondary | ICD-10-CM | POA: Diagnosis not present

## 2021-01-21 DIAGNOSIS — Z79899 Other long term (current) drug therapy: Secondary | ICD-10-CM | POA: Diagnosis not present

## 2021-01-21 DIAGNOSIS — Z23 Encounter for immunization: Secondary | ICD-10-CM | POA: Diagnosis not present

## 2021-01-21 DIAGNOSIS — R079 Chest pain, unspecified: Secondary | ICD-10-CM | POA: Diagnosis not present

## 2021-01-21 DIAGNOSIS — Z87891 Personal history of nicotine dependence: Secondary | ICD-10-CM | POA: Diagnosis not present

## 2021-01-21 DIAGNOSIS — I10 Essential (primary) hypertension: Secondary | ICD-10-CM | POA: Diagnosis not present

## 2021-01-21 DIAGNOSIS — N529 Male erectile dysfunction, unspecified: Secondary | ICD-10-CM | POA: Diagnosis not present

## 2021-01-21 DIAGNOSIS — R0602 Shortness of breath: Secondary | ICD-10-CM | POA: Diagnosis not present

## 2021-01-21 DIAGNOSIS — E119 Type 2 diabetes mellitus without complications: Secondary | ICD-10-CM | POA: Diagnosis not present

## 2021-01-21 DIAGNOSIS — R931 Abnormal findings on diagnostic imaging of heart and coronary circulation: Secondary | ICD-10-CM | POA: Diagnosis not present

## 2021-01-21 DIAGNOSIS — E782 Mixed hyperlipidemia: Secondary | ICD-10-CM

## 2021-01-21 DIAGNOSIS — I2510 Atherosclerotic heart disease of native coronary artery without angina pectoris: Secondary | ICD-10-CM | POA: Diagnosis not present

## 2021-01-21 DIAGNOSIS — E785 Hyperlipidemia, unspecified: Secondary | ICD-10-CM | POA: Diagnosis not present

## 2021-01-21 DIAGNOSIS — Z7289 Other problems related to lifestyle: Secondary | ICD-10-CM | POA: Diagnosis not present

## 2021-01-21 LAB — GLUCOSE, CAPILLARY
Glucose-Capillary: 150 mg/dL — ABNORMAL HIGH (ref 70–99)
Glucose-Capillary: 100 mg/dL — ABNORMAL HIGH (ref 70–99)

## 2021-01-21 LAB — CBC WITH DIFFERENTIAL/PLATELET
Abs Immature Granulocytes: 0 10*3/uL (ref 0.00–0.07)
Basophils Absolute: 0 10*3/uL (ref 0.0–0.1)
Basophils Relative: 1 %
Eosinophils Absolute: 0.1 10*3/uL (ref 0.0–0.5)
Eosinophils Relative: 2 %
HCT: 41.8 % (ref 39.0–52.0)
Hemoglobin: 14.2 g/dL (ref 13.0–17.0)
Immature Granulocytes: 0 %
Lymphocytes Relative: 41 %
Lymphs Abs: 1.6 10*3/uL (ref 0.7–4.0)
MCH: 30.6 pg (ref 26.0–34.0)
MCHC: 34 g/dL (ref 30.0–36.0)
MCV: 90.1 fL (ref 80.0–100.0)
Monocytes Absolute: 0.4 10*3/uL (ref 0.1–1.0)
Monocytes Relative: 10 %
Neutro Abs: 1.8 10*3/uL (ref 1.7–7.7)
Neutrophils Relative %: 46 %
Platelets: 175 10*3/uL (ref 150–400)
RBC: 4.64 MIL/uL (ref 4.22–5.81)
RDW: 13.3 % (ref 11.5–15.5)
WBC: 3.9 10*3/uL — ABNORMAL LOW (ref 4.0–10.5)
nRBC: 0 % (ref 0.0–0.2)

## 2021-01-21 LAB — ECHOCARDIOGRAM COMPLETE
AR max vel: 2.88 cm2
AV Area VTI: 2.73 cm2
AV Area mean vel: 2.69 cm2
AV Mean grad: 4 mmHg
AV Peak grad: 8 mmHg
Ao pk vel: 1.41 m/s
Area-P 1/2: 3.6 cm2
Height: 76 in
Weight: 3932.8 oz

## 2021-01-21 LAB — RAPID URINE DRUG SCREEN, HOSP PERFORMED
Amphetamines: NOT DETECTED
Barbiturates: NOT DETECTED
Benzodiazepines: NOT DETECTED
Cocaine: NOT DETECTED
Opiates: NOT DETECTED
Tetrahydrocannabinol: NOT DETECTED

## 2021-01-21 LAB — CBG MONITORING, ED: Glucose-Capillary: 110 mg/dL — ABNORMAL HIGH (ref 70–99)

## 2021-01-21 LAB — BASIC METABOLIC PANEL
Anion gap: 4 — ABNORMAL LOW (ref 5–15)
BUN: 11 mg/dL (ref 8–23)
CO2: 28 mmol/L (ref 22–32)
Calcium: 9 mg/dL (ref 8.9–10.3)
Chloride: 106 mmol/L (ref 98–111)
Creatinine, Ser: 1.03 mg/dL (ref 0.61–1.24)
GFR, Estimated: >60 mL/min (ref >60)
Glucose, Bld: 102 mg/dL — ABNORMAL HIGH (ref 70–99)
Potassium: 4.2 mmol/L (ref 3.5–5.1)
Sodium: 138 mmol/L (ref 135–145)

## 2021-01-21 LAB — TSH: TSH: 1.626 u[IU]/mL (ref 0.350–4.500)

## 2021-01-21 LAB — MAGNESIUM: Magnesium: 2.2 mg/dL (ref 1.7–2.4)

## 2021-01-21 LAB — LDL CHOLESTEROL, DIRECT: Direct LDL: 104.8 mg/dL — ABNORMAL HIGH (ref 0–99)

## 2021-01-21 LAB — BRAIN NATRIURETIC PEPTIDE: B Natriuretic Peptide: 42.4 pg/mL (ref 0.0–100.0)

## 2021-01-21 LAB — HIV ANTIBODY (ROUTINE TESTING W REFLEX): HIV Screen 4th Generation wRfx: NONREACTIVE

## 2021-01-21 MED ORDER — ONDANSETRON HCL 4 MG/2ML IJ SOLN: 4.0000 mg | Freq: Four times a day (QID) | INTRAMUSCULAR | Status: DC | PRN

## 2021-01-21 MED ORDER — ADULT MULTIVITAMIN W/MINERALS CH
1.0000 | ORAL_TABLET | Freq: Every day | ORAL | Status: DC
  Administered 2021-01-21 – 2021-01-22 (×2): 1 via ORAL
  Filled 2021-01-21 (×2): qty 1

## 2021-01-21 MED ORDER — THIAMINE HCL 100 MG/ML IJ SOLN: 100.0000 mg | Freq: Every day | INTRAMUSCULAR | Status: DC

## 2021-01-21 MED ORDER — THIAMINE HCL 100 MG PO TABS
100.0000 mg | ORAL_TABLET | Freq: Every day | ORAL | Status: DC
  Administered 2021-01-21 – 2021-01-22 (×2): 100 mg via ORAL
  Filled 2021-01-21 (×2): qty 1

## 2021-01-21 MED ORDER — TAMSULOSIN HCL 0.4 MG PO CAPS
0.4000 mg | ORAL_CAPSULE | Freq: Every day | ORAL | Status: DC
  Administered 2021-01-21: 0.4 mg via ORAL
  Filled 2021-01-21 (×2): qty 1

## 2021-01-21 MED ORDER — FOLIC ACID 1 MG PO TABS
1.0000 mg | ORAL_TABLET | Freq: Every day | ORAL | Status: DC
  Administered 2021-01-21 – 2021-01-22 (×2): 1 mg via ORAL
  Filled 2021-01-21 (×2): qty 1

## 2021-01-21 MED ORDER — ACETAMINOPHEN 325 MG PO TABS
650.0000 mg | ORAL_TABLET | ORAL | Status: DC | PRN
  Administered 2021-01-22: 650 mg via ORAL
  Filled 2021-01-21: qty 2

## 2021-01-21 MED ORDER — INFLUENZA VAC A&B SA ADJ QUAD 0.5 ML IM PRSY
0.5000 mL | PREFILLED_SYRINGE | INTRAMUSCULAR | Status: AC
  Administered 2021-01-22: 0.5 mL via INTRAMUSCULAR
  Filled 2021-01-21: qty 0.5

## 2021-01-21 MED ORDER — ENOXAPARIN SODIUM 40 MG/0.4ML IJ SOSY
40.0000 mg | PREFILLED_SYRINGE | INTRAMUSCULAR | Status: DC
  Administered 2021-01-21: 40 mg via SUBCUTANEOUS
  Filled 2021-01-21: qty 0.4

## 2021-01-21 MED ORDER — NITROGLYCERIN 0.4 MG SL SUBL: 0.4000 mg | SUBLINGUAL_TABLET | SUBLINGUAL | Status: DC | PRN

## 2021-01-21 NOTE — Hospital Course (Addendum)
Mark Cook is a 65 yo M presenting with chest pain with PMHx of DM2, HLD, chronic alcohol use. Hospital course is noted below.   Chest Pain  [facility] with onset of chest pain that began yesterday on 9/13 causing him to go to the ER. In the ED, the pt was given a bolus of fluids, pepcid, nitro, and ASA 324mg . EKG at that time showed no evidence of acute ischemia and trops were negative. Chest x-Muhannad was stable. Because of his cardiac risk factors, Heart Score of 5, he was transferred here to be admitted for observation.  Updated Labs/Imaging: CBC unremarkable, electrolytes wnl, trops 11>14, microalb/creatinine ratio <3, EKG: NS without acute ST segment changes, CXR shows no acute changes. Patient was under observation and we consulted cardiology. Cardio got a coronary CTA that was reassuring and advised that we send him home with ASA and a statin.   Chronic Alcohol Use   Pt reports of daily alcohol use with 1 shot of liquor daily with 1-2 beers and has no history of withdrawal. No tremors evident or signs of withdrawal at this time. CIWA protocol initiated. Thiamine and folate provided while inpatient. Social work consult for substance use disorder.   DM2  HA1C 09/08/20: 5.7. CBGs 96>98>110. Home medication: Metformin 500 mg 1xdaily. Held home metformin inpatient. Regular CBGs monitoring.  Continue with home medication outpatient.    HLD  09/08/20 Lipid Panel: Total 173, Triglycerides 189, LDL 91. Will start the patient on Crestor 20mg .    Elevated Pressures  Pt has had intermittent elevated pressures throughout this admission. Cardio advised to start an ARB. Will start an ARB for outpatient.   Issues for follow up  Follow up with PCP 1 week after discharge  Follow up with PCP about new blood pressure medication and monitor pressures  Cardiology has signed off  Substance use resources were provided
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
## 2021-01-21 NOTE — Progress Notes (Signed)
CSW received consult for substance use resources for patient. CSW spoke with patient at bedside. CSW offered patient outpatient substance use treatment services resources. Patient accepted. 
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
## 2021-01-22 ENCOUNTER — Observation Stay (HOSPITAL_COMMUNITY)
Admit: 2021-01-22 | Discharge: 2021-01-22 | Disposition: A | Discharge status: Home / Self Care | Payer: Medicare HMO | Attending: Internal Medicine | Admitting: Internal Medicine

## 2021-01-22 ENCOUNTER — Observation Stay (HOSPITAL_COMMUNITY): Payer: Medicare HMO

## 2021-01-22 ENCOUNTER — Other Ambulatory Visit: Payer: Self-pay | Admitting: Internal Medicine

## 2021-01-22 DIAGNOSIS — E782 Mixed hyperlipidemia: Secondary | ICD-10-CM | POA: Diagnosis not present

## 2021-01-22 DIAGNOSIS — R0789 Other chest pain: Secondary | ICD-10-CM | POA: Diagnosis not present

## 2021-01-22 DIAGNOSIS — Z87891 Personal history of nicotine dependence: Secondary | ICD-10-CM | POA: Diagnosis not present

## 2021-01-22 DIAGNOSIS — R079 Chest pain, unspecified: Secondary | ICD-10-CM | POA: Diagnosis not present

## 2021-01-22 DIAGNOSIS — Z7984 Long term (current) use of oral hypoglycemic drugs: Secondary | ICD-10-CM | POA: Diagnosis not present

## 2021-01-22 DIAGNOSIS — Z79899 Other long term (current) drug therapy: Secondary | ICD-10-CM | POA: Diagnosis not present

## 2021-01-22 DIAGNOSIS — E119 Type 2 diabetes mellitus without complications: Secondary | ICD-10-CM | POA: Diagnosis not present

## 2021-01-22 DIAGNOSIS — I10 Essential (primary) hypertension: Secondary | ICD-10-CM | POA: Diagnosis not present

## 2021-01-22 DIAGNOSIS — I2510 Atherosclerotic heart disease of native coronary artery without angina pectoris: Secondary | ICD-10-CM | POA: Diagnosis not present

## 2021-01-22 DIAGNOSIS — F1010 Alcohol abuse, uncomplicated: Secondary | ICD-10-CM | POA: Diagnosis not present

## 2021-01-22 DIAGNOSIS — Z20822 Contact with and (suspected) exposure to covid-19: Secondary | ICD-10-CM | POA: Diagnosis not present

## 2021-01-22 DIAGNOSIS — Z23 Encounter for immunization: Secondary | ICD-10-CM | POA: Diagnosis not present

## 2021-01-22 DIAGNOSIS — R931 Abnormal findings on diagnostic imaging of heart and coronary circulation: Secondary | ICD-10-CM

## 2021-01-22 LAB — CBC
HCT: 39.2 % (ref 39.0–52.0)
Hemoglobin: 13.5 g/dL (ref 13.0–17.0)
MCH: 30.8 pg (ref 26.0–34.0)
MCHC: 34.4 g/dL (ref 30.0–36.0)
MCV: 89.5 fL (ref 80.0–100.0)
Platelets: 153 10*3/uL (ref 150–400)
RBC: 4.38 MIL/uL (ref 4.22–5.81)
RDW: 13.2 % (ref 11.5–15.5)
WBC: 4.5 10*3/uL (ref 4.0–10.5)
nRBC: 0 % (ref 0.0–0.2)

## 2021-01-22 LAB — GLUCOSE, CAPILLARY
Glucose-Capillary: 121 mg/dL — ABNORMAL HIGH (ref 70–99)
Glucose-Capillary: 107 mg/dL — ABNORMAL HIGH (ref 70–99)

## 2021-01-22 LAB — BASIC METABOLIC PANEL
Anion gap: 9 (ref 5–15)
BUN: 10 mg/dL (ref 8–23)
CO2: 23 mmol/L (ref 22–32)
Calcium: 8.9 mg/dL (ref 8.9–10.3)
Chloride: 106 mmol/L (ref 98–111)
Creatinine, Ser: 1.03 mg/dL (ref 0.61–1.24)
GFR, Estimated: >60 mL/min (ref >60)
Glucose, Bld: 107 mg/dL — ABNORMAL HIGH (ref 70–99)
Potassium: 4.3 mmol/L (ref 3.5–5.1)
Sodium: 138 mmol/L (ref 135–145)

## 2021-01-22 MED ORDER — NITROGLYCERIN 0.4 MG SL SUBL
SUBLINGUAL_TABLET | SUBLINGUAL | Status: AC
  Administered 2021-01-22: 0.8 mg
  Filled 2021-01-22: qty 2

## 2021-01-22 MED ORDER — FOLIC ACID 1 MG PO TABS: 1.0000 mg | ORAL_TABLET | Freq: Every day | ORAL | 1 refills | Status: DC

## 2021-01-22 MED ORDER — LOSARTAN POTASSIUM 25 MG PO TABS: 25.0000 mg | ORAL_TABLET | Freq: Every day | ORAL | 1 refills | Status: DC

## 2021-01-22 MED ORDER — THIAMINE HCL 100 MG PO TABS: 100.0000 mg | ORAL_TABLET | Freq: Every day | ORAL | 1 refills | Status: DC

## 2021-01-22 MED ORDER — LISINOPRIL 5 MG PO TABS
5.0000 mg | ORAL_TABLET | Freq: Every day | ORAL | Status: DC
  Administered 2021-01-22: 5 mg via ORAL
  Filled 2021-01-22: qty 1

## 2021-01-22 MED ORDER — IOHEXOL 350 MG/ML SOLN
95.0000 mL | Freq: Once | INTRAVENOUS | Status: AC | PRN
  Administered 2021-01-22: 95 mL via INTRAVENOUS

## 2021-01-22 MED ORDER — ROSUVASTATIN CALCIUM 20 MG PO TABS: 20.0000 mg | ORAL_TABLET | Freq: Every day | ORAL | 1 refills | Status: DC

## 2021-01-22 MED ORDER — ASPIRIN EC 81 MG PO TBEC: 81.0000 mg | DELAYED_RELEASE_TABLET | Freq: Every day | ORAL | 11 refills | Status: DC

## 2021-01-22 NOTE — Discharge Instructions (Addendum)
It was a pleasure to take care of you while you were in the hospital!  While here, you were treated for chest pain. The echocardiogram (ultrasound of your heart) showed normal output of blood, but did show greatly enlarged chambers (right and left atria) with diastolic dysfunction (decreased filling of the heart). Your coronary CT showed that you have moderate atherosclerotic disease (meaning cholesterol plaques), but none of these are completely blocking your arteries. The cardiologist recommends the following:  Take one baby aspirin 81 mg daily  Take crestor 20 mg daily Follow up with the [facility] on September 26 Follow up with Dr. Allyson Sabal, cardiology, outpatient Decrease your alcohol consumption  Crestor is a statin that helps prevent further cholesterol plaques from forming and helps stabilize the ones that exist to prevent them from causing heart attacks and strokes. Aspirin will also help with this.
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
## 2021-01-22 NOTE — Progress Notes (Signed)
  Coronary CTA Showed   IMPRESSION: 1. Possible moderate lesion in the mid portion of first diagonal branch, CADRADS = 3. CT FFR will be performed and reported separately. Otherwise, minimal CAD.   2. Coronary calcium score is 28.5, which places the patient in the 60th percentile for age and sex matched control.   3. Normal coronary origins with right dominance.   4.  Mild dilation of the sinus of Valsalva, 41 mm.  IMPRESSION: 1. CT FFR analysis showed no significant stenosis. The mid portion of the first diagonal could not be mapped to evaluate degree of stenosis. The vessel is small caliber in this region and likely best addressed with medical therapy.   RECOMMENDATIONS: Guideline-directed medical therapy and aggressive risk factor modification for secondary prevention of coronary artery disease.  Discussed results with Dr. Allyson Sabal. Recommendation: Start aspirin 81 mg daily Lipitor 20 mg daily. Follow-up with PCP. Cardiology follow-up with Dr. Allyson Sabal as needed.

## 2021-01-22 NOTE — Discharge Summary (Signed)
Family Medicine Teaching [facility] Discharge Summary  Patient name: Mark Cook Medical record number: [id number] Date of birth: [birth year] Age: 65 y.o. Gender: male Date of Admission: 01/20/2021  Date of Discharge: 9/15 Admitting Physician: No admitting provider for patient encounter.  Primary Care Provider: Evelena Leyden, DO Consultants: Cardiology   Indication for Hospitalization: Chest pain  Discharge Diagnoses/Problem List:  Active Problems:   Chest pain Type 2 diabetes Hyperlipidemia Chronic alcohol use Insomnia  Disposition: Home   Discharge Condition: Stable   Discharge Exam:  Blood pressure 137/82, pulse (!) 59, temperature (!) 97.5 F (36.4 C), temperature source Oral, resp. rate 18, height 6\' 4"  (1.93 m), weight 111.5 kg, SpO2 98 %. Constitutional:      General: He is not in acute distress.    Appearance: He is well-developed. He is not ill-appearing.  Cardiovascular:     Rate and Rhythm: Normal rate and regular rhythm.     Heart sounds: Normal heart sounds.  Pulmonary:     Effort: Pulmonary effort is normal. No tachypnea.     Breath sounds: Normal breath sounds.  Abdominal:     General: Bowel sounds are normal.     Palpations: Abdomen is soft.     Tenderness: There is no abdominal tenderness.  Neurological:     Mental Status: He is alert.  Psychiatric:        Mood and Affect: Mood normal.        Behavior: Behavior normal.   Brief Hospital Course:  Japheth Diekman is a 65 yo M presenting with chest pain with PMHx of DM2, HLD, chronic alcohol use. Hospital course is noted below.   Chest Pain  Pt arrive to the [facility] with onset of chest pain that began yesterday on 9/13 causing him to go to the ER. In the ED, the pt was given a bolus of fluids, pepcid, nitro, and ASA 324mg . EKG at that time showed no evidence of acute ischemia and troponins were negative. Chest x-Quientin was stable. Because of his cardiac risk factors, and moderate Heart Score of 5, he was  transferred here to be admitted for observation.  Updated Labs/Imaging: CBC unremarkable, electrolytes wnl, trops 11>14, microalb/creatinine ratio <3, EKG: NS without acute ST segment changes, CXR shows no acute changes. Echo showed normal EF65-75%, mild concentric LVH, G1DD, severely dilated RA and LA. Patient was under observation and we consulted cardiology. Cardiology obtained a coronary CTA that was reassuring without evidence significant stenosis, but recommended medical therapy and aggressive risk factor modification with daily ASA 81 mg and statin. Patient could not tolerate lipitor due to dizziness, so he was started on crestor 20 mg at discharge. He should follow up with cardiology in the outpatient setting.  Chronic Alcohol Use   Pt reports daily alcohol use with 1 shot of liquor daily with 1-2 beers and has no history of withdrawal. No tremors evident or signs of withdrawal at this time. CIWA protocol initiated. Thiamine and folate provided while inpatient. Social work consult for substance use disorder. Counseled patient on risks of alcohol use, especially given dilated LA, RA on echo. He will consider decreasing his use. He reports that he has terrible insomnia and drinks EtOH to help fall asleep. We discussed that over time, EtOH will make his insomnia worse and decreases sleep quality. Recommend patient be evaluated for causes of insomnia, especially anxiety and/or depression.   DM2  Hgb A1C 09/08/20: 5.7%. CBGs 96>98>110. Home medication: Metformin 500 mg 1xdaily. Held home metformin inpatient.  Regular CBGs monitoring.  Continue with home medication outpatient.    HLD  09/08/20 Lipid Panel: Total 173, Triglycerides 189, LDL 91. Will start the patient on Crestor 20mg .    Elevated Pressures  Pt has had intermittent elevated pressures throughout this admission. Cardio advised to start an ARB. Will start losartan 25 mg at discharge. Please follow up with BMP at [facility].  Issues for follow up   Follow up with PCP 1 week after discharge  Follow up with PCP about new blood pressure medication and monitor pressures. Get BMP to check electrolytes Follow up with cardiology as an outpatient Discuss with patient his EtOH use, likely is greater than reported Evaluate patient's insomnia, screen for anxiety or depression as possible causes which are driving EtOH use  Significant Procedures: None   Significant Labs and Imaging:  Recent Labs  Lab 01/20/21 2005 01/21/21 1300 01/22/21 0331  WBC 4.7 3.9* 4.5  HGB 13.0 14.2 13.5  HCT 37.7* 41.8 39.2  PLT 162 175 153   Recent Labs  Lab 01/20/21 1851 01/20/21 2000 01/21/21 1300 01/22/21 0331  NA 138  --  138 138  K 4.9  --  4.2 4.3  CL 106  --  106 106  CO2 20*  --  28 23  GLUCOSE 87  --  102* 107*  BUN 16  --  11 10  CREATININE 1.01  --  1.03 1.03  CALCIUM 9.2  --  9.0 8.9  MG  --   --  2.2  --   ALKPHOS  --  44  --   --   AST  --  26  --   --   ALT  --  32  --   --   ALBUMIN  --  4.3  --   --    01/20/2021 chest xray: IMPRESSION: 1. No active cardiopulmonary disease. 2. Question emphysematous changes of the lungs.   CTA coronary 01/22/21: IMPRESSION: 1. CT FFR analysis showed no significant stenosis. The mid portion of the first diagonal could not be mapped to evaluate degree of stenosis. The vessel is small caliber in this region and likely best addressed with medical therapy.   RECOMMENDATIONS: Guideline-directed medical therapy and aggressive risk factor modification for secondary prevention of coronary artery disease.  Results/Tests Pending at Time of Discharge: None   Discharge Medications:  Allergies as of 01/22/2021       Reactions   No Known Allergies         Medication List     STOP taking these medications    atorvastatin 20 MG tablet Commonly known as: LIPITOR       TAKE these medications    Acetaminophen Extra Strength 500 MG tablet Generic drug: acetaminophen TAKE ONE TABLET BY  MOUTH EVERY 6 HOURS AS NEEDED What changed:  how much to take reasons to take this   aspirin EC 81 MG tablet Take 1 tablet (81 mg total) by mouth daily. Swallow whole.   folic acid 1 MG tablet Commonly known as: FOLVITE Take 1 tablet (1 mg total) by mouth daily. Start taking on: January 23, 2021   ibuprofen 600 MG tablet Commonly known as: ADVIL TAKE ONE TABLET BY MOUTH EVERY 8 HOURS AS NEEDED What changed: reasons to take this   losartan 25 MG tablet Commonly known as: COZAAR Take 1 tablet (25 mg total) by mouth daily.   metFORMIN 500 MG tablet Commonly known as: GLUCOPHAGE Take 1 tablet (500 mg total) by mouth  daily with breakfast.   multivitamin with minerals Tabs tablet Take 1 tablet by mouth daily. Centrum   naproxen sodium 220 MG tablet Commonly known as: ALEVE Take 440 mg by mouth daily as needed (pain).   rosuvastatin 20 MG tablet Commonly known as: CRESTOR Take 1 tablet (20 mg total) by mouth daily.   sildenafil 100 MG tablet Commonly known as: VIAGRA TAKE 1/2 TABLET BY MOUTH DAILY AS NEEDED FOR ERECTILE DYSFUNCTION What changed:  how much to take how to take this when to take this reasons to take this additional instructions   tamsulosin 0.4 MG Caps capsule Commonly known as: FLOMAX TAKE ONE CAPSULE BY MOUTH DAILY   thiamine 100 MG tablet Take 1 tablet (100 mg total) by mouth daily. Start taking on: January 23, 2021   VISINE DRY EYE OP Apply 1 drop to eye daily as needed (dry eyes).        Discharge Instructions: Please refer to Patient Instructions section of EMR for full details.  Patient was counseled important signs and symptoms that should prompt return to medical care, changes in medications, dietary instructions, activity restrictions, and follow up appointments.   Follow-Up Appointments:  Follow-up Information     [facility]. Go on 02/02/2021.   Why: Please arrive 15 mins early for 1:30 PM  appointment Contact information: [address] [place] [address] [phone]        Runell Gess, MD. Call.   Specialties: Cardiology, Radiology Contact information: [address] [place] [address] [phone]                 Alfredo Martinez, MD PGY-1, [facility] Family Medicine  Shirlean Mylar, MD [facility] Family Medicine Residency, PGY-3

## 2021-02-02 ENCOUNTER — Ambulatory Visit (INDEPENDENT_AMBULATORY_CARE_PROVIDER_SITE_OTHER): Payer: Medicare HMO | Admitting: Family Medicine

## 2021-02-02 ENCOUNTER — Other Ambulatory Visit: Payer: Self-pay

## 2021-02-02 VITALS — BP 122/75 | HR 70 | Ht 76.0 in | Wt 242.6 lb

## 2021-02-02 DIAGNOSIS — G4709 Other insomnia: Secondary | ICD-10-CM | POA: Diagnosis not present

## 2021-02-02 DIAGNOSIS — F1010 Alcohol abuse, uncomplicated: Secondary | ICD-10-CM | POA: Insufficient documentation

## 2021-02-02 DIAGNOSIS — E785 Hyperlipidemia, unspecified: Secondary | ICD-10-CM

## 2021-02-02 DIAGNOSIS — Z89422 Acquired absence of other left toe(s): Secondary | ICD-10-CM | POA: Diagnosis not present

## 2021-02-02 DIAGNOSIS — G4700 Insomnia, unspecified: Secondary | ICD-10-CM | POA: Insufficient documentation

## 2021-02-02 DIAGNOSIS — R030 Elevated blood-pressure reading, without diagnosis of hypertension: Secondary | ICD-10-CM

## 2021-02-02 DIAGNOSIS — R0789 Other chest pain: Secondary | ICD-10-CM | POA: Diagnosis not present

## 2021-02-02 NOTE — Assessment & Plan Note (Signed)
Patient wishes to try melatonin Suggested starting with 3 mg 1 hour prior to optimal bedtime Patient will follow-up in 2 weeks to discuss further

## 2021-02-02 NOTE — Progress Notes (Signed)
    SUBJECTIVE:   CHIEF COMPLAINT / HPI: hosp f/u   Chest Pain f/u  Denies recurring chest pain or difficulty breathing.  Patient reports he has not received an appointment for cardiology outpatient follow up. He has been tolerating and adhering to regimen including ASA, crestor and losartan.   ETOH Use  Patient reports drinking no more than 210 ounce beers per day.  He denies any recent symptoms of withdrawal.   Insomnia  Reports having problems with sleeping for the last 6 months. Reports as he is experiencing difficulties with finding a life partner.  Patient states that he normally will take a glass of brandy or some type of alcohol but he states he "should not" in order to help with sleep.  Patient prefers to have a natural aid to help with his sleep.  GAD-7 is 4 today and patient reports somewhat difficult changes because of this.   Near end of visit, patient stated that he would like to further discuss right-sided pain.  He reports that he was told by chiropractor that he had leg discrepancy.  Informed patient that I will make note of this for his follow-up visit in 2 weeks.  Patient was in agreement with this.  Patient also mentioned that he would like to discuss erectile dysfunction and his Viagra medication.  PERTINENT  PMH / PSH:    OBJECTIVE:   BP 122/75   Pulse 70   Ht 6\' 4"  (1.93 m)   Wt 242 lb 9.6 oz (110 kg)   SpO2 97%   BMI 29.53 kg/m   General: male appearing stated age in no acute distress Cardio: Normal S1 and S2, no S3 or S4. Rhythm is regular. No murmurs or rubs.  Bilateral radial pulses palpable Pulm: Clear to auscultation bilaterally, no crackles, wheezing, or diminished breath sounds. Normal respiratory effort, stable on room air Abdomen: Bowel sounds normal. Abdomen soft and non-tender. Extremities: No peripheral edema. Warm/ well perfused. Neuro: pt alert and oriented x4, extraocular muscles intact bilaterally   ASSESSMENT/PLAN:   Chest  pain Referral sent to cardiology for patient to have outpatient follow-up Patient to continue aspirin, losartan and Crestor as recommended by cardiology during his inpatient stay Will measure BMP today as patient has been started on losartan  Hyperlipidemia Patient to continue statin   Excessive consumption of ethanol Patient denies excessive alcohol consumption since discharge from hospital. Reviewed acceptable amounts of alcohol consumption. Would recommend discussion regarding naltrexone at follow up appt to help with alcohol cessation   Insomnia Patient wishes to try melatonin Suggested starting with 3 mg 1 hour prior to optimal bedtime Patient will follow-up in 2 weeks to discuss further     , MD [facility]

## 2021-02-02 NOTE — Assessment & Plan Note (Addendum)
Referral sent to cardiology for patient to have outpatient follow-up Patient to continue aspirin, losartan and Crestor as recommended by cardiology during his inpatient stay Will measure BMP today as patient has been started on losartan

## 2021-02-02 NOTE — Patient Instructions (Addendum)
It was a pleasure to see you today!  Thank you for choosing [facility] for your primary care.   Mark Cook was seen for hospital follow-up.   Our plans for today were: Chest pain: Please continue to take your aspirin, Crestor and losartan.  I have submitted a referral to cardiology to schedule an appointment.  Please be on the look out for a call from the cardiology office within the next few weeks in order to schedule this appointment. For your insomnia, recommend buying melatonin over-the-counter.  I will suggest daughter with 3 mg that she should take 1 hour prior to the time you would like to go to sleep. Today we will check your blood work to measure your electrolyte levels after starting your blood pressure medication.  I will follow with you once results are available.  To keep you healthy, please keep in mind the following health maintenance items that you are due for:   Colonoscopy eye exam Shingles vaccine   You should return to our clinic in 2 weeks for follow up for blood pressure, insomnia and viagra.   Best Wishes,   Dr. Neita Garnet

## 2021-02-02 NOTE — Assessment & Plan Note (Addendum)
Patient denies excessive alcohol consumption since discharge from hospital. Reviewed acceptable amounts of alcohol consumption. Would recommend discussion regarding naltrexone at follow up appt to help with alcohol cessation

## 2021-02-02 NOTE — Assessment & Plan Note (Signed)
Patient to continue statin

## 2021-02-03 LAB — BASIC METABOLIC PANEL
BUN/Creatinine Ratio: 16 (ref 10–24)
BUN: 16 mg/dL (ref 8–27)
CO2: 23 mmol/L (ref 20–29)
Calcium: 9.5 mg/dL (ref 8.6–10.2)
Chloride: 102 mmol/L (ref 96–106)
Creatinine, Ser: 0.99 mg/dL (ref 0.76–1.27)
Glucose: 117 mg/dL — ABNORMAL HIGH (ref 70–99)
Potassium: 4.2 mmol/L (ref 3.5–5.2)
Sodium: 140 mmol/L (ref 134–144)
eGFR: 85 mL/min/{1.73_m2} (ref >59)

## 2021-02-09 ENCOUNTER — Encounter: Payer: Self-pay | Admitting: Family Medicine

## 2021-02-11 ENCOUNTER — Other Ambulatory Visit: Payer: Self-pay | Admitting: Family Medicine

## 2021-02-11 DIAGNOSIS — N529 Male erectile dysfunction, unspecified: Secondary | ICD-10-CM

## 2021-02-16 ENCOUNTER — Ambulatory Visit: Payer: Medicare HMO | Admitting: Family Medicine

## 2021-03-10 ENCOUNTER — Ambulatory Visit: Payer: Medicare HMO | Admitting: Family Medicine
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
## 2021-03-11 ENCOUNTER — Ambulatory Visit (HOSPITAL_BASED_OUTPATIENT_CLINIC_OR_DEPARTMENT_OTHER): Payer: Medicare HMO | Admitting: General Practice

## 2021-03-24 ENCOUNTER — Other Ambulatory Visit: Payer: Self-pay | Admitting: Family Medicine

## 2021-03-24 DIAGNOSIS — N529 Male erectile dysfunction, unspecified: Secondary | ICD-10-CM

## 2021-03-25 ENCOUNTER — Other Ambulatory Visit: Payer: Self-pay | Admitting: Family Medicine
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
## 2021-04-13 ENCOUNTER — Ambulatory Visit (HOSPITAL_BASED_OUTPATIENT_CLINIC_OR_DEPARTMENT_OTHER): Payer: Medicare HMO | Admitting: General Practice

## 2021-05-07 ENCOUNTER — Other Ambulatory Visit: Payer: Self-pay | Admitting: Family Medicine

## 2021-05-07 DIAGNOSIS — N529 Male erectile dysfunction, unspecified: Secondary | ICD-10-CM

## 2021-05-29 ENCOUNTER — Other Ambulatory Visit: Payer: Self-pay | Admitting: Family Medicine

## 2021-05-29 DIAGNOSIS — N529 Male erectile dysfunction, unspecified: Secondary | ICD-10-CM

## 2021-06-01 ENCOUNTER — Other Ambulatory Visit: Payer: Self-pay | Admitting: Family Medicine

## 2021-06-01 DIAGNOSIS — N529 Male erectile dysfunction, unspecified: Secondary | ICD-10-CM

## 2021-06-11 ENCOUNTER — Other Ambulatory Visit: Payer: Self-pay

## 2021-06-11 DIAGNOSIS — N529 Male erectile dysfunction, unspecified: Secondary | ICD-10-CM

## 2021-06-11 MED ORDER — SILDENAFIL CITRATE 100 MG PO TABS: ORAL_TABLET | ORAL | 0 refills | Status: DC

## 2021-06-19 ENCOUNTER — Other Ambulatory Visit: Payer: Self-pay

## 2021-06-19 ENCOUNTER — Encounter: Payer: Self-pay | Admitting: Family Medicine

## 2021-06-19 ENCOUNTER — Ambulatory Visit (INDEPENDENT_AMBULATORY_CARE_PROVIDER_SITE_OTHER): Payer: Medicare HMO | Admitting: Family Medicine

## 2021-06-19 VITALS — BP 131/80 | HR 84 | Ht 76.0 in | Wt 247.6 lb

## 2021-06-19 DIAGNOSIS — G8929 Other chronic pain: Secondary | ICD-10-CM

## 2021-06-19 DIAGNOSIS — E1142 Type 2 diabetes mellitus with diabetic polyneuropathy: Secondary | ICD-10-CM | POA: Diagnosis not present

## 2021-06-19 DIAGNOSIS — N529 Male erectile dysfunction, unspecified: Secondary | ICD-10-CM

## 2021-06-19 DIAGNOSIS — M545 Low back pain, unspecified: Secondary | ICD-10-CM

## 2021-06-19 LAB — POCT GLYCOSYLATED HEMOGLOBIN (HGB A1C): HbA1c, POC (controlled diabetic range): 5.7 % (ref 0.0–7.0)

## 2021-06-19 MED ORDER — SILDENAFIL CITRATE 100 MG PO TABS: ORAL_TABLET | ORAL | 2 refills | Status: DC

## 2021-06-19 NOTE — Assessment & Plan Note (Signed)
Patient with continued pain, considered secondary to arthritis per the patient report.  Recommended alternating Tylenol and ibuprofen at this time. - Referral placed to PT - We will consider if trial of gabapentin or other medication would be appropriate if no improvement with PT

## 2021-06-19 NOTE — Patient Instructions (Signed)
I have placed a referral for physical therapy, someone should be contacting you in the next several weeks to follow-up for evaluation.  There are risks to taking Viagra, if you ever are given medications that contain the words nitroglycerin and you should not be taking the Viagra.  If you come to a point where you are not able to walk up a flight of stairs due to chest pain or shortness of breath we will avoid this medication as well.  Your A1c today was 5.7 which is great.  Continue your current medications as prescribed.
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
## 2021-06-19 NOTE — Assessment & Plan Note (Signed)
BP stable.  ASCVD risk of 28.4%, discussed risks with the patient he would like to continue the medication. - Refill provided - Cautioned patient if ever prescribed nitrates and if exercise tolerance decreases

## 2021-06-19 NOTE — Assessment & Plan Note (Signed)
A1c today 5.7. - Continue home medications as prescribed

## 2021-07-01 ENCOUNTER — Other Ambulatory Visit: Payer: Self-pay

## 2021-07-01 ENCOUNTER — Ambulatory Visit: Payer: Medicare HMO | Attending: Family Medicine | Admitting: Physical Therapy

## 2021-07-01 ENCOUNTER — Encounter: Payer: Self-pay | Admitting: Physical Therapy

## 2021-07-01 DIAGNOSIS — R2689 Other abnormalities of gait and mobility: Secondary | ICD-10-CM | POA: Diagnosis not present

## 2021-07-01 DIAGNOSIS — R2681 Unsteadiness on feet: Secondary | ICD-10-CM | POA: Diagnosis not present

## 2021-07-01 DIAGNOSIS — M545 Low back pain, unspecified: Secondary | ICD-10-CM | POA: Insufficient documentation

## 2021-07-01 DIAGNOSIS — G8929 Other chronic pain: Secondary | ICD-10-CM | POA: Insufficient documentation

## 2021-07-01 DIAGNOSIS — R293 Abnormal posture: Secondary | ICD-10-CM | POA: Insufficient documentation

## 2021-07-01 DIAGNOSIS — M6281 Muscle weakness (generalized): Secondary | ICD-10-CM | POA: Insufficient documentation
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
## 2021-07-10 ENCOUNTER — Other Ambulatory Visit: Payer: Self-pay

## 2021-07-10 ENCOUNTER — Ambulatory Visit: Payer: Medicare HMO | Attending: Family Medicine

## 2021-07-10 DIAGNOSIS — M545 Low back pain, unspecified: Secondary | ICD-10-CM | POA: Diagnosis not present

## 2021-07-10 DIAGNOSIS — M6281 Muscle weakness (generalized): Secondary | ICD-10-CM | POA: Insufficient documentation

## 2021-07-10 DIAGNOSIS — R2689 Other abnormalities of gait and mobility: Secondary | ICD-10-CM | POA: Diagnosis not present

## 2021-07-10 DIAGNOSIS — R293 Abnormal posture: Secondary | ICD-10-CM | POA: Diagnosis not present

## 2021-07-10 DIAGNOSIS — R2681 Unsteadiness on feet: Secondary | ICD-10-CM | POA: Diagnosis not present
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
## 2021-07-11 ENCOUNTER — Encounter: Payer: Self-pay | Admitting: Physical Therapy

## 2021-07-11 ENCOUNTER — Ambulatory Visit: Payer: Medicare HMO | Admitting: Physical Therapy

## 2021-07-11 DIAGNOSIS — R2689 Other abnormalities of gait and mobility: Secondary | ICD-10-CM | POA: Diagnosis not present

## 2021-07-11 DIAGNOSIS — R2681 Unsteadiness on feet: Secondary | ICD-10-CM

## 2021-07-11 DIAGNOSIS — M6281 Muscle weakness (generalized): Secondary | ICD-10-CM

## 2021-07-11 DIAGNOSIS — M545 Low back pain, unspecified: Secondary | ICD-10-CM | POA: Diagnosis not present

## 2021-07-11 DIAGNOSIS — R293 Abnormal posture: Secondary | ICD-10-CM
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
## 2021-07-13 ENCOUNTER — Other Ambulatory Visit: Payer: Self-pay

## 2021-07-13 ENCOUNTER — Emergency Department (HOSPITAL_BASED_OUTPATIENT_CLINIC_OR_DEPARTMENT_OTHER): Payer: Medicare HMO | Admitting: Radiology

## 2021-07-13 ENCOUNTER — Encounter (HOSPITAL_BASED_OUTPATIENT_CLINIC_OR_DEPARTMENT_OTHER): Payer: Self-pay

## 2021-07-13 DIAGNOSIS — Z7982 Long term (current) use of aspirin: Secondary | ICD-10-CM | POA: Diagnosis not present

## 2021-07-13 DIAGNOSIS — Z7984 Long term (current) use of oral hypoglycemic drugs: Secondary | ICD-10-CM | POA: Insufficient documentation

## 2021-07-13 DIAGNOSIS — M25562 Pain in left knee: Secondary | ICD-10-CM | POA: Diagnosis not present

## 2021-07-13 DIAGNOSIS — E1140 Type 2 diabetes mellitus with diabetic neuropathy, unspecified: Secondary | ICD-10-CM | POA: Insufficient documentation

## 2021-07-13 DIAGNOSIS — S86912A Strain of unspecified muscle(s) and tendon(s) at lower leg level, left leg, initial encounter: Secondary | ICD-10-CM | POA: Diagnosis not present

## 2021-07-13 DIAGNOSIS — S8992XA Unspecified injury of left lower leg, initial encounter: Secondary | ICD-10-CM | POA: Diagnosis present

## 2021-07-13 DIAGNOSIS — S76112A Strain of left quadriceps muscle, fascia and tendon, initial encounter: Secondary | ICD-10-CM | POA: Diagnosis not present

## 2021-07-13 DIAGNOSIS — R600 Localized edema: Secondary | ICD-10-CM | POA: Diagnosis not present

## 2021-07-13 DIAGNOSIS — X501XXA Overexertion from prolonged static or awkward postures, initial encounter: Secondary | ICD-10-CM | POA: Insufficient documentation

## 2021-07-13 NOTE — ED Triage Notes (Signed)
Patient here POV from Home with Knee Injury. ? ?Patient fell earlier this AM on the Steps and injured his Left Knee by twisting it. ? ?No Blood Thinners. No Head Injury. No LOC. Moderate Swelling to Same. ? ?NAD Noted during Triage. A&Ox4. GCS 15. BIB wheelchair. ?

## 2021-07-14 ENCOUNTER — Emergency Department (HOSPITAL_BASED_OUTPATIENT_CLINIC_OR_DEPARTMENT_OTHER)
Admission: EM | Admit: 2021-07-14 | Discharge: 2021-07-14 | Disposition: A | Discharge status: Home / Self Care | Payer: Medicare HMO | Attending: Emergency Medicine | Admitting: Emergency Medicine

## 2021-07-14 DIAGNOSIS — S76112A Strain of left quadriceps muscle, fascia and tendon, initial encounter: Secondary | ICD-10-CM | POA: Diagnosis not present

## 2021-07-14 MED ORDER — HYDROCODONE-ACETAMINOPHEN 5-325 MG PO TABS: 1.0000 | ORAL_TABLET | Freq: Four times a day (QID) | ORAL | 0 refills | Status: DC | PRN

## 2021-07-14 MED ORDER — HYDROCODONE-ACETAMINOPHEN 5-325 MG PO TABS
2.0000 | ORAL_TABLET | Freq: Once | ORAL | Status: AC
  Administered 2021-07-14: 2 via ORAL
  Filled 2021-07-14: qty 2

## 2021-07-14 NOTE — ED Provider Notes (Signed)
?[facility] EMERGENCY DEPT ?Provider Note ? ? ?CSN: [id number] ?Arrival date & time: 07/13/21  1938 ? ?  ? ?History ? ?Chief Complaint  ?Patient presents with  ? Knee Pain  ? ? ?Mark Cook is a 66 y.o. male. ? ?Patient is a 66 year old male with past medical history of type 2 diabetes, diabetic neuropathy.  Patient presenting today for evaluation of a left knee injury.  Patient reports walking up stairs this morning when he tripped and fell and his knee twisted awkwardly.  He has had pain and swelling above his kneecap since.  He denies any numbness or tingling.  He denies other injury. ? ?The history is provided by the patient.  ?Knee Pain ?Location:  Knee ?Time since incident:  12 hours ?Injury: yes   ?Mechanism of injury: fall   ?Knee location:  L knee ?Pain details:  ?  Quality:  Aching ?  Radiates to:  Does not radiate ?  Severity:  Moderate ?  Onset quality:  Sudden ?  Timing:  Constant ?  Progression:  Worsening ? ?  ? ?Home Medications ?Prior to Admission medications   ?Medication Sig Start Date End Date Taking? Authorizing Provider  ?ACETAMINOPHEN EXTRA STRENGTH 500 MG tablet TAKE ONE TABLET BY MOUTH EVERY 6 HOURS AS NEEDED ?Patient taking differently: Take 500 mg by mouth every 6 (six) hours as needed for headache or mild pain. 12/04/19   Mirian Mo, MD  ?aspirin EC 81 MG tablet Take 1 tablet (81 mg total) by mouth daily. Swallow whole. 01/22/21 01/22/22  Shirlean Mylar, MD  ?folic acid (FOLVITE) 1 MG tablet TAKE ONE TABLET BY MOUTH DAILY 03/25/21   Lilland, Percival Spanish, DO  ?Glycerin-Hypromellose-PEG 400 (VISINE DRY EYE OP) Apply 1 drop to eye daily as needed (dry eyes).    [provider]  ?ibuprofen (ADVIL) 600 MG tablet TAKE ONE TABLET BY MOUTH EVERY 8 HOURS AS NEEDED ?Patient taking differently: Take 600 mg by mouth every 8 (eight) hours as needed for headache or mild pain. 04/19/19   Mirian Mo, MD  ?losartan (COZAAR) 25 MG tablet Take 1 tablet (25 mg total) by mouth daily. 01/22/21    Shirlean Mylar, MD  ?metFORMIN (GLUCOPHAGE) 500 MG tablet Take 1 tablet (500 mg total) by mouth daily with breakfast. ?Patient taking differently: Take 500 mg by mouth daily with breakfast. 09/08/20   Dollene Cleveland, DO  ?Multiple Vitamin (MULTIVITAMIN WITH MINERALS) TABS tablet Take 1 tablet by mouth daily. Centrum    [provider]  ?rosuvastatin (CRESTOR) 20 MG tablet Take 1 tablet (20 mg total) by mouth daily. 01/22/21   Shirlean Mylar, MD  ?sildenafil (VIAGRA) 100 MG tablet TAKE 1/2 TABLET BY MOUTH DAILY AS NEEDED FOR ERECTILE DYSFUNCTION. No refills will be provided until an appointment has been completed with your physician. 06/19/21   Lilland, Percival Spanish, DO  ?tamsulosin (FLOMAX) 0.4 MG CAPS capsule TAKE ONE CAPSULE BY MOUTH DAILY 07/05/19   Mirian Mo, MD  ?thiamine 100 MG tablet Take 1 tablet (100 mg total) by mouth daily. 01/23/21   Shirlean Mylar, MD  ?   ? ?Allergies    ?No known allergies   ? ?Review of Systems   ?Review of Systems  ?All other systems reviewed and are negative. ? ?Physical Exam ?Updated Vital Signs ?BP 121/77   Pulse 63   Temp (!) 97 ?F (36.1 ?C) (Temporal)   Resp 18   Ht 6\' 4"  (1.93 m)   Wt 112.3 kg   SpO2 98%  BMI 30.14 kg/m?  ?Physical Exam ?Vitals and nursing note reviewed.  ?Constitutional:   ?   General: He is not in acute distress. ?   Appearance: Normal appearance. He is not ill-appearing.  ?HENT:  ?   Head: Normocephalic and atraumatic.  ?Pulmonary:  ?   Effort: Pulmonary effort is normal.  ?Musculoskeletal:  ?   Comments: There is significant swelling to the suprapatellar soft tissues.  Patient is unable to extend the knee.  Distal pulses, motor, and sensation are intact.  Remainder of knee exam limited secondary to pain.  ?Skin: ?   General: Skin is warm and dry.  ?Neurological:  ?   Mental Status: He is alert.  ? ? ?ED Results / Procedures / Treatments   ?Labs ?(all labs ordered are listed, but only abnormal results are displayed) ?Labs Reviewed - No  data to display ? ?EKG ?None ? ?Radiology ?DG Knee Complete 4 Views Left ? ?Result Date: 07/13/2021 ?CLINICAL DATA:  Left knee pain after fall. EXAM: LEFT KNEE - COMPLETE 4+ VIEW COMPARISON:  Left knee radiograph 02/20/2019 FINDINGS: No acute fracture or dislocation. Moderate tricompartmental osteoarthritis. There is a small knee joint effusion. Fragmented quadriceps tendon enthesophyte with slight displacement of calcifications from prior exam. Quadriceps tendon is ill-defined. Prominent prepatellar soft tissue edema. IMPRESSION: 1. No acute fracture or dislocation of the left knee. 2. Large amount of soft tissue edema. Fragmented quadriceps tendon enthesophyte with slight displacement of calcifications from prior exam. Quadriceps tendon is ill-defined. Findings may represent quadriceps tendon injury in the appropriate clinical setting. 3. Moderate tricompartmental osteoarthritis with small joint effusion. Electronically Signed   By: Narda Rutherford M.D.   On: 07/13/2021 20:49   ? ?Procedures ?Procedures  ? ? ?Medications Ordered in ED ?Medications  ?HYDROcodone-acetaminophen (NORCO/VICODIN) 5-325 MG per tablet 2 tablet (has no administration in time range)  ? ? ?ED Course/ Medical Decision Making/ A&P ? ?Patient's x-rays and knee exam consistent with a quadriceps tendon rupture.  Patient to be placed in a knee immobilizer, given crutches, pain medication, and is to follow-up with orthopedics in the next 2 days. ? ?Final Clinical Impression(s) / ED Diagnoses ?Final diagnoses:  ?None  ? ? ?Rx / DC Orders ?ED Discharge Orders   ? ? None  ? ?  ? ? ?  ?Geoffery Lyons, MD ?07/14/21 2130 ? ?

## 2021-07-14 NOTE — Discharge Instructions (Signed)
Wear knee immobilizer until followed up by orthopedics. ? ?Ice for 20 minutes every 2 hours while awake for the next 2 days. ? ?Keep your leg elevated as much as possible. ? ?Take hydrocodone as prescribed as needed for pain. ? ?Follow-up with orthopedics in the next 2 to 3 days.  The contact information for Dr. Jena Gauss has been provided in this discharge summary for you to call and make these arrangements. ?

## 2021-07-14 NOTE — ED Notes (Signed)
Pt verbalizes understanding of discharge instructions. Opportunity for questioning and answers were provided. Pt discharged from ED to home.   ? ?

## 2021-07-16 ENCOUNTER — Other Ambulatory Visit: Payer: Self-pay | Admitting: Student

## 2021-07-16 DIAGNOSIS — S76112A Strain of left quadriceps muscle, fascia and tendon, initial encounter: Secondary | ICD-10-CM

## 2021-07-17 ENCOUNTER — Ambulatory Visit: Payer: Medicare HMO

## 2021-07-18 ENCOUNTER — Ambulatory Visit: Payer: Medicare HMO | Admitting: Physical Therapy

## 2021-07-20 ENCOUNTER — Other Ambulatory Visit: Payer: Self-pay | Admitting: Student

## 2021-07-20 DIAGNOSIS — S86912A Strain of unspecified muscle(s) and tendon(s) at lower leg level, left leg, initial encounter: Secondary | ICD-10-CM

## 2021-07-22 DIAGNOSIS — S838X2A Sprain of other specified parts of left knee, initial encounter: Secondary | ICD-10-CM | POA: Diagnosis not present

## 2021-07-22 DIAGNOSIS — M25562 Pain in left knee: Secondary | ICD-10-CM | POA: Diagnosis not present

## 2021-07-22 DIAGNOSIS — S8392XA Sprain of unspecified site of left knee, initial encounter: Secondary | ICD-10-CM | POA: Diagnosis not present

## 2021-07-24 ENCOUNTER — Ambulatory Visit: Payer: Medicare HMO

## 2021-07-25 ENCOUNTER — Ambulatory Visit: Payer: Medicare HMO | Admitting: Physical Therapy

## 2021-07-28 ENCOUNTER — Other Ambulatory Visit: Payer: Medicare HMO

## 2021-07-28 ENCOUNTER — Ambulatory Visit
Admission: RE | Admit: 2021-07-28 | Discharge: 2021-07-28 | Disposition: A | Discharge status: Home / Self Care | Payer: Medicare HMO | Source: Ambulatory Visit | Attending: Student | Admitting: Student

## 2021-07-28 DIAGNOSIS — S86912A Strain of unspecified muscle(s) and tendon(s) at lower leg level, left leg, initial encounter: Secondary | ICD-10-CM

## 2021-07-28 DIAGNOSIS — S76112A Strain of left quadriceps muscle, fascia and tendon, initial encounter: Secondary | ICD-10-CM | POA: Diagnosis not present

## 2021-07-28 DIAGNOSIS — M25462 Effusion, left knee: Secondary | ICD-10-CM | POA: Diagnosis not present

## 2021-07-28 DIAGNOSIS — M25562 Pain in left knee: Secondary | ICD-10-CM | POA: Diagnosis not present

## 2021-07-30 ENCOUNTER — Ambulatory Visit: Payer: Self-pay | Admitting: Student

## 2021-07-30 ENCOUNTER — Other Ambulatory Visit: Payer: Self-pay | Admitting: Orthopaedic Surgery

## 2021-07-30 DIAGNOSIS — M25562 Pain in left knee: Secondary | ICD-10-CM

## 2021-07-31 ENCOUNTER — Encounter (HOSPITAL_COMMUNITY): Payer: Self-pay | Admitting: Student

## 2021-07-31 ENCOUNTER — Other Ambulatory Visit: Payer: Self-pay

## 2021-07-31 ENCOUNTER — Ambulatory Visit: Payer: Medicare HMO | Admitting: Physical Therapy
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
## 2021-07-31 NOTE — Anesthesia Preprocedure Evaluation (Addendum)
Anesthesia Evaluation  ?Patient identified by MRN, date of birth, ID band ?Patient awake ? ? ? ?Reviewed: ?Allergy & Precautions, NPO status , Patient's Chart, lab work & pertinent test results ? ?Airway ?Mallampati: III ? ?TM Distance: >3 FB ?Neck ROM: Full ? ? ? Dental ? ?(+) Edentulous Upper, Missing ?  ?Pulmonary ?former smoker,  ?  ?Pulmonary exam normal ?breath sounds clear to auscultation ? ? ? ? ? ? Cardiovascular ?negative cardio ROS ? ? ?Rhythm:Regular Rate:Bradycardia ? ? ?  ?Neuro/Psych ? Neuromuscular disease negative psych ROS  ? GI/Hepatic ?negative GI ROS, Neg liver ROS,   ?Endo/Other  ?diabetes, Oral Hypoglycemic Agents ? Renal/GU ?negative Renal ROS  ? ?  ?Musculoskeletal ? ?(+) Arthritis ,  ? Abdominal ?  ?Peds ? Hematology ?negative hematology ROS ?(+)   ?Anesthesia Other Findings ?Left quad tendon rupture ? Reproductive/Obstetrics ? ?  ? ? ? ? ? ? ? ? ? ? ? ? ? ?  ?  ? ? ? ? ? ? ?Anesthesia Physical ?Anesthesia Plan ? ?ASA: 2 ? ?Anesthesia Plan: General and Regional  ? ?Post-op Pain Management:   ? ?Induction: Intravenous ? ?PONV Risk Score and Plan: 2 and Ondansetron, Dexamethasone, Midazolam and Treatment may vary due to age or medical condition ? ?Airway Management Planned: LMA ? ?Additional Equipment:  ? ?Intra-op Plan:  ? ?Post-operative Plan: Extubation in OR ? ?Informed Consent: I have reviewed the patients History and Physical, chart, labs and discussed the procedure including the risks, benefits and alternatives for the proposed anesthesia with the patient or authorized representative who has indicated his/her understanding and acceptance.  ? ? ? ?Dental advisory given ? ?Plan Discussed with: CRNA ? ?Anesthesia Plan Comments: (PAT note written 07/31/2021 by Shonna Chock, PA-C. ?)  ? ? ? ? ?Anesthesia Quick Evaluation ? ?

## 2021-07-31 NOTE — Progress Notes (Addendum)
Spoke with pt for pre-op call. Pt states he's never had any heart problems. He was admitted to the hospital in September 2022 and was seen by cardiology. Was told to start 81 mg ASA and Rosuvastatin. Pt states he no longer takes these medications.  Denies HTN. Pt is a type 2 Diabetic. His last A1C was 5.7 on 06/19/21. Pt states he occasionally checks his blood sugar at home, but has not done so recently. Instructed him to hold his Metformin morning of surgery. Instructed him to check his blood sugar when he wakes up Monday AM. If blood sugar is 70 or below, treat with 1/2 cup of clear juice (apple or cranberry) and recheck blood sugar 15 minutes after drinking juice. Instructed pt to notify his nurse when he arrives Monday morning if he had to drink the juice. He voiced understanding.  ? ?Shower instructions given to pt. ? ?I asked pt to call the Pharmacy Tech to have them update his med list. He states he will do that. ? ?PCP - Dr. Clayborne Artist with [facility] ? ?Chart sent to Anesthesia PA.  ?

## 2021-08-01 ENCOUNTER — Ambulatory Visit: Payer: Medicare HMO | Admitting: Physical Therapy
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
## 2021-08-03 ENCOUNTER — Ambulatory Visit (HOSPITAL_COMMUNITY): Payer: Medicare HMO | Admitting: Vascular Surgery

## 2021-08-03 ENCOUNTER — Encounter (HOSPITAL_COMMUNITY): Payer: Self-pay | Admitting: Student

## 2021-08-03 ENCOUNTER — Ambulatory Visit (HOSPITAL_COMMUNITY)
Admission: RE | Admit: 2021-08-03 | Discharge: 2021-08-03 | Disposition: A | Discharge status: Home / Self Care | Payer: Medicare HMO | Attending: Student | Admitting: Student

## 2021-08-03 ENCOUNTER — Ambulatory Visit (HOSPITAL_BASED_OUTPATIENT_CLINIC_OR_DEPARTMENT_OTHER): Payer: Medicare HMO | Admitting: Vascular Surgery

## 2021-08-03 ENCOUNTER — Encounter (HOSPITAL_COMMUNITY)
Admission: RE | Disposition: A | Discharge status: Home / Self Care | Payer: Self-pay | Source: Home / Self Care | Attending: Student

## 2021-08-03 ENCOUNTER — Other Ambulatory Visit: Payer: Self-pay

## 2021-08-03 DIAGNOSIS — S76112A Strain of left quadriceps muscle, fascia and tendon, initial encounter: Secondary | ICD-10-CM | POA: Insufficient documentation

## 2021-08-03 DIAGNOSIS — Z89422 Acquired absence of other left toe(s): Secondary | ICD-10-CM | POA: Diagnosis not present

## 2021-08-03 DIAGNOSIS — Z87891 Personal history of nicotine dependence: Secondary | ICD-10-CM | POA: Diagnosis not present

## 2021-08-03 DIAGNOSIS — W010XXA Fall on same level from slipping, tripping and stumbling without subsequent striking against object, initial encounter: Secondary | ICD-10-CM | POA: Diagnosis not present

## 2021-08-03 DIAGNOSIS — G8918 Other acute postprocedural pain: Secondary | ICD-10-CM | POA: Diagnosis not present

## 2021-08-03 DIAGNOSIS — Z8616 Personal history of COVID-19: Secondary | ICD-10-CM | POA: Diagnosis not present

## 2021-08-03 DIAGNOSIS — E1140 Type 2 diabetes mellitus with diabetic neuropathy, unspecified: Secondary | ICD-10-CM | POA: Diagnosis not present

## 2021-08-03 DIAGNOSIS — Z7984 Long term (current) use of oral hypoglycemic drugs: Secondary | ICD-10-CM | POA: Diagnosis not present

## 2021-08-03 DIAGNOSIS — S76102A Unspecified injury of left quadriceps muscle, fascia and tendon, initial encounter: Secondary | ICD-10-CM | POA: Diagnosis not present

## 2021-08-03 HISTORY — PX: QUADRICEPS TENDON REPAIR: SHX756

## 2021-08-03 LAB — COMPREHENSIVE METABOLIC PANEL
ALT: 23 U/L (ref 0–44)
AST: 24 U/L (ref 15–41)
Albumin: 3.8 g/dL (ref 3.5–5.0)
Alkaline Phosphatase: 47 U/L (ref 38–126)
Anion gap: 8 (ref 5–15)
BUN: 18 mg/dL (ref 8–23)
CO2: 24 mmol/L (ref 22–32)
Calcium: 9 mg/dL (ref 8.9–10.3)
Chloride: 107 mmol/L (ref 98–111)
Creatinine, Ser: 1.09 mg/dL (ref 0.61–1.24)
GFR, Estimated: >60 mL/min (ref >60)
Glucose, Bld: 128 mg/dL — ABNORMAL HIGH (ref 70–99)
Potassium: 4 mmol/L (ref 3.5–5.1)
Sodium: 139 mmol/L (ref 135–145)
Total Bilirubin: 0.7 mg/dL (ref 0.3–1.2)
Total Protein: 6.6 g/dL (ref 6.5–8.1)

## 2021-08-03 LAB — CBC
HCT: 37.5 % — ABNORMAL LOW (ref 39.0–52.0)
Hemoglobin: 12.9 g/dL — ABNORMAL LOW (ref 13.0–17.0)
MCH: 30.5 pg (ref 26.0–34.0)
MCHC: 34.4 g/dL (ref 30.0–36.0)
MCV: 88.7 fL (ref 80.0–100.0)
Platelets: 184 10*3/uL (ref 150–400)
RBC: 4.23 MIL/uL (ref 4.22–5.81)
RDW: 13.2 % (ref 11.5–15.5)
WBC: 5.1 10*3/uL (ref 4.0–10.5)
nRBC: 0 % (ref 0.0–0.2)

## 2021-08-03 LAB — GLUCOSE, CAPILLARY
Glucose-Capillary: 134 mg/dL — ABNORMAL HIGH (ref 70–99)
Glucose-Capillary: 161 mg/dL — ABNORMAL HIGH (ref 70–99)

## 2021-08-03 SURGERY — REPAIR, TENDON, QUADRICEPS
Anesthesia: General | Site: Leg Upper | Laterality: Left

## 2021-08-03 MED ORDER — AMISULPRIDE (ANTIEMETIC) 5 MG/2ML IV SOLN: 10.0000 mg | Freq: Once | INTRAVENOUS | Status: DC | PRN

## 2021-08-03 MED ORDER — PROPOFOL 10 MG/ML IV BOLUS
INTRAVENOUS | Status: DC | PRN
Start: 2021-08-03 — End: 2021-08-03
  Administered 2021-08-03: 200 mg via INTRAVENOUS

## 2021-08-03 MED ORDER — EPHEDRINE 5 MG/ML INJ
INTRAVENOUS | Status: AC
  Filled 2021-08-03: qty 5

## 2021-08-03 MED ORDER — VANCOMYCIN HCL 1000 MG IV SOLR: INTRAVENOUS | Status: DC | PRN

## 2021-08-03 MED ORDER — FENTANYL CITRATE (PF) 100 MCG/2ML IJ SOLN
INTRAMUSCULAR | Status: AC
  Filled 2021-08-03: qty 2

## 2021-08-03 MED ORDER — KETOROLAC TROMETHAMINE 15 MG/ML IJ SOLN
INTRAMUSCULAR | Status: AC
  Filled 2021-08-03: qty 1

## 2021-08-03 MED ORDER — DEXAMETHASONE SODIUM PHOSPHATE 10 MG/ML IJ SOLN
INTRAMUSCULAR | Status: DC | PRN
Start: 2021-08-03 — End: 2021-08-03
  Administered 2021-08-03: 5 mg via INTRAVENOUS

## 2021-08-03 MED ORDER — FENTANYL CITRATE (PF) 250 MCG/5ML IJ SOLN
INTRAMUSCULAR | Status: DC | PRN
  Administered 2021-08-03 (×4): 50 ug via INTRAVENOUS

## 2021-08-03 MED ORDER — OXYCODONE-ACETAMINOPHEN 5-325 MG PO TABS: 1.0000 | ORAL_TABLET | Freq: Four times a day (QID) | ORAL | 0 refills | Status: DC | PRN

## 2021-08-03 MED ORDER — FENTANYL CITRATE (PF) 100 MCG/2ML IJ SOLN
25.0000 ug | INTRAMUSCULAR | Status: DC | PRN
  Administered 2021-08-03 (×2): 50 ug via INTRAVENOUS

## 2021-08-03 MED ORDER — LIDOCAINE 2% (20 MG/ML) 5 ML SYRINGE
INTRAMUSCULAR | Status: AC
  Filled 2021-08-03: qty 5

## 2021-08-03 MED ORDER — ACETAMINOPHEN 10 MG/ML IV SOLN
1000.0000 mg | Freq: Once | INTRAVENOUS | Status: DC | PRN
  Administered 2021-08-03: 1000 mg via INTRAVENOUS

## 2021-08-03 MED ORDER — CEFAZOLIN SODIUM-DEXTROSE 2-4 GM/100ML-% IV SOLN
2.0000 g | INTRAVENOUS | Status: AC
  Administered 2021-08-03: 2 g via INTRAVENOUS
  Filled 2021-08-03: qty 100

## 2021-08-03 MED ORDER — LACTATED RINGERS IV SOLN: INTRAVENOUS | Status: DC

## 2021-08-03 MED ORDER — ONDANSETRON HCL 4 MG/2ML IJ SOLN: 4.0000 mg | Freq: Once | INTRAMUSCULAR | Status: DC | PRN

## 2021-08-03 MED ORDER — MIDAZOLAM HCL 2 MG/2ML IJ SOLN
INTRAMUSCULAR | Status: AC
  Filled 2021-08-03: qty 2

## 2021-08-03 MED ORDER — 0.9 % SODIUM CHLORIDE (POUR BTL) OPTIME
TOPICAL | Status: DC | PRN
  Administered 2021-08-03: 1000 mL

## 2021-08-03 MED ORDER — PROPOFOL 10 MG/ML IV BOLUS
INTRAVENOUS | Status: AC
  Filled 2021-08-03: qty 20

## 2021-08-03 MED ORDER — CHLORHEXIDINE GLUCONATE 0.12 % MT SOLN
15.0000 mL | Freq: Once | OROMUCOSAL | Status: AC
  Administered 2021-08-03: 15 mL via OROMUCOSAL
  Filled 2021-08-03: qty 15

## 2021-08-03 MED ORDER — KETOROLAC TROMETHAMINE 15 MG/ML IJ SOLN
15.0000 mg | Freq: Once | INTRAMUSCULAR | Status: AC | PRN
  Administered 2021-08-03: 15 mg via INTRAVENOUS

## 2021-08-03 MED ORDER — INSULIN ASPART 100 UNIT/ML IJ SOLN: 0.0000 [IU] | INTRAMUSCULAR | Status: DC | PRN

## 2021-08-03 MED ORDER — ASPIRIN EC 81 MG PO TBEC: 81.0000 mg | DELAYED_RELEASE_TABLET | Freq: Two times a day (BID) | ORAL | Status: AC

## 2021-08-03 MED ORDER — OXYCODONE-ACETAMINOPHEN 5-325 MG PO TABS
ORAL_TABLET | ORAL | Status: AC
  Filled 2021-08-03: qty 1

## 2021-08-03 MED ORDER — ONDANSETRON HCL 4 MG/2ML IJ SOLN
INTRAMUSCULAR | Status: AC
  Filled 2021-08-03: qty 2

## 2021-08-03 MED ORDER — VANCOMYCIN HCL 1000 MG IV SOLR
INTRAVENOUS | Status: AC
  Filled 2021-08-03: qty 20

## 2021-08-03 MED ORDER — EPHEDRINE SULFATE-NACL 50-0.9 MG/10ML-% IV SOSY
PREFILLED_SYRINGE | INTRAVENOUS | Status: DC | PRN
  Administered 2021-08-03: 5 mg via INTRAVENOUS
  Administered 2021-08-03: 10 mg via INTRAVENOUS
  Administered 2021-08-03: 5 mg via INTRAVENOUS

## 2021-08-03 MED ORDER — ONDANSETRON HCL 4 MG/2ML IJ SOLN
INTRAMUSCULAR | Status: DC | PRN
  Administered 2021-08-03: 4 mg via INTRAVENOUS

## 2021-08-03 MED ORDER — ORAL CARE MOUTH RINSE: 15.0000 mL | Freq: Once | OROMUCOSAL | Status: AC

## 2021-08-03 MED ORDER — DEXAMETHASONE SODIUM PHOSPHATE 10 MG/ML IJ SOLN
INTRAMUSCULAR | Status: AC
  Filled 2021-08-03: qty 1

## 2021-08-03 MED ORDER — MIDAZOLAM HCL 5 MG/5ML IJ SOLN
INTRAMUSCULAR | Status: DC | PRN
  Administered 2021-08-03 (×2): 1 mg via INTRAVENOUS

## 2021-08-03 MED ORDER — LIDOCAINE 2% (20 MG/ML) 5 ML SYRINGE
INTRAMUSCULAR | Status: DC | PRN
  Administered 2021-08-03: 40 mg via INTRAVENOUS

## 2021-08-03 MED ORDER — ACETAMINOPHEN 10 MG/ML IV SOLN
INTRAVENOUS | Status: AC
  Filled 2021-08-03: qty 100

## 2021-08-03 MED ORDER — BUPIVACAINE-EPINEPHRINE (PF) 0.5% -1:200000 IJ SOLN
INTRAMUSCULAR | Status: DC | PRN
  Administered 2021-08-03: 30 mL via PERINEURAL

## 2021-08-03 MED ORDER — FENTANYL CITRATE (PF) 250 MCG/5ML IJ SOLN
INTRAMUSCULAR | Status: AC
  Filled 2021-08-03: qty 5

## 2021-08-03 SURGICAL SUPPLY — 55 items
ADH SKN CLS APL DERMABOND .7 (GAUZE/BANDAGES/DRESSINGS) ×1
ANCH SUT CRKSW FT 1.3X (Anchor) ×2 IMPLANT
ANCHOR SUT BIOCOMP CORKSREW (Anchor) ×2 IMPLANT
APL PRP STRL LF DISP 70% ISPRP (MISCELLANEOUS) ×2
APL SKNCLS STERI-STRIP NONHPOA (GAUZE/BANDAGES/DRESSINGS) ×1
BAG COUNTER SPONGE SURGICOUNT (BAG) ×2 IMPLANT
BAG SPNG CNTER NS LX DISP (BAG) ×1
BENZOIN TINCTURE PRP APPL 2/3 (GAUZE/BANDAGES/DRESSINGS) ×1 IMPLANT
BNDG ELASTIC 4X5.8 VLCR STR LF (GAUZE/BANDAGES/DRESSINGS) ×1 IMPLANT
BNDG ELASTIC 6X5.8 VLCR STR LF (GAUZE/BANDAGES/DRESSINGS) ×3 IMPLANT
BRUSH SCRUB EZ PLAIN DRY (MISCELLANEOUS) ×2 IMPLANT
CHLORAPREP W/TINT 26 (MISCELLANEOUS) ×4 IMPLANT
CLSR STERI-STRIP ANTIMIC 1/2X4 (GAUZE/BANDAGES/DRESSINGS) ×2 IMPLANT
COVER SURGICAL LIGHT HANDLE (MISCELLANEOUS) ×2 IMPLANT
CUFF TOURN SGL QUICK 34 (TOURNIQUET CUFF)
CUFF TOURN SGL QUICK 42 (TOURNIQUET CUFF) IMPLANT
CUFF TRNQT CYL 34X4.125X (TOURNIQUET CUFF) IMPLANT
DERMABOND ADVANCED (GAUZE/BANDAGES/DRESSINGS) ×1
DERMABOND ADVANCED .7 DNX12 (GAUZE/BANDAGES/DRESSINGS) ×1 IMPLANT
DRAPE IMP U-DRAPE 54X76 (DRAPES) ×4 IMPLANT
DRAPE U-SHAPE 47X51 STRL (DRAPES) ×2 IMPLANT
DRSG PAD ABDOMINAL 8X10 ST (GAUZE/BANDAGES/DRESSINGS) ×1 IMPLANT
ELECT REM PT RETURN 9FT ADLT (ELECTROSURGICAL)
ELECTRODE REM PT RTRN 9FT ADLT (ELECTROSURGICAL) IMPLANT
GAUZE SPONGE 4X4 12PLY STRL (GAUZE/BANDAGES/DRESSINGS) ×1 IMPLANT
GLOVE SURG ENC MOIS LTX SZ6.5 (GLOVE) ×6 IMPLANT
GLOVE SURG ENC MOIS LTX SZ7.5 (GLOVE) ×8 IMPLANT
GLOVE SURG UNDER POLY LF SZ6.5 (GLOVE) ×2 IMPLANT
GLOVE SURG UNDER POLY LF SZ7.5 (GLOVE) ×2 IMPLANT
GOWN STRL REUS W/ TWL LRG LVL3 (GOWN DISPOSABLE) ×2 IMPLANT
GOWN STRL REUS W/TWL LRG LVL3 (GOWN DISPOSABLE) ×4
IMMOBILIZER KNEE 22 UNIV (SOFTGOODS) ×1 IMPLANT
KIT BASIN OR (CUSTOM PROCEDURE TRAY) ×2 IMPLANT
KIT TRANSTIBIAL (DISPOSABLE) ×1 IMPLANT
KIT TURNOVER KIT B (KITS) ×2 IMPLANT
MANIFOLD NEPTUNE II (INSTRUMENTS) ×2 IMPLANT
NDL TAPERED W/ NITINOL LOOP (MISCELLANEOUS) IMPLANT
NEEDLE TAPERED W/ NITINOL LOOP (MISCELLANEOUS) ×2 IMPLANT
NS IRRIG 1000ML POUR BTL (IV SOLUTION) ×2 IMPLANT
PACK TOTAL JOINT (CUSTOM PROCEDURE TRAY) ×2 IMPLANT
PAD ARMBOARD 7.5X6 YLW CONV (MISCELLANEOUS) ×4 IMPLANT
PAD CAST 4YDX4 CTTN HI CHSV (CAST SUPPLIES) IMPLANT
PADDING CAST ABS 6INX4YD NS (CAST SUPPLIES) ×1
PADDING CAST ABS COTTON 6X4 NS (CAST SUPPLIES) IMPLANT
PADDING CAST COTTON 4X4 STRL (CAST SUPPLIES) ×2
SPONGE T-LAP 18X18 ~~LOC~~+RFID (SPONGE) ×2 IMPLANT
STAPLER VISISTAT 35W (STAPLE) ×2 IMPLANT
SUT FIBERWIRE #5 38 CONV NDL (SUTURE)
SUT MNCRL AB 3-0 PS2 18 (SUTURE) ×2 IMPLANT
SUT PROLENE 0 CT (SUTURE) IMPLANT
SUT VIC AB 0 CT1 27 (SUTURE) ×6
SUT VIC AB 0 CT1 27XBRD ANBCTR (SUTURE) ×3 IMPLANT
SUT VIC AB 2-0 CT1 27 (SUTURE) ×6
SUT VIC AB 2-0 CT1 TAPERPNT 27 (SUTURE) ×3 IMPLANT
SUTURE FIBERWR #5 38 CONV NDL (SUTURE) IMPLANT

## 2021-08-03 NOTE — Op Note (Signed)
Orthopaedic Surgery Operative Note (CSN: [id number] ) ?Date of Surgery: 08/03/2021  ?Admit Date: 08/03/2021  ? ?Diagnoses: ?Pre-Op Diagnoses: ?Repair of left quadriceps tendon ? ?Post-Op Diagnosis: ?Same  ? ?Procedures: ?CPT 27385-Repair of left quadriceps tendon rupture ? ?Surgeons : ?Primary: Roby Lofts, MD ? ?Assistant: Ulyses Southward, PA-C ? ?Location: OR 3  ? ?Anesthesia:General with regional block  ? ?Antibiotics: Ancef 2g preop with 1 gm vancomycin powder placed topically  ? ?Tourniquet time:None used   ? ?Estimated Blood Loss:25 mL ? ?Complications: None ? ?Specimens:None  ? ?Implants: ?Implant Name Type Inv. Item Serial No. Manufacturer Lot No. LRB No. Used Action  ?ANCHOR SUT Colbert - [id number] Anchor ANCHOR [employer] [id number] Left 1 Implanted  ?ANCHOR SUT [place] - [id number] Anchor ANCHOR [id number] Left 1 Implanted  ?  ? ?Indications for Surgery: ?66 year old male who sustained a quadriceps rupture after a fall.  I confirmed this with ultrasound.  I felt that he was indicated for primary repair.  Risks and benefits were discussed with the patient.  Risks include but not limited to bleeding, infection, failure of repair, residual quad weakness, need for further surgery, DVT, even the possibility anesthetic complications.  He agreed to proceed with surgery and consent was obtained. ? ?Operative Findings: ?Open repair of quadriceps rupture using Arthrex 5.5 mm corkscrew anchors ? ?Procedure: ?The patient was identified in the preoperative holding area. Consent was confirmed with the patient and their family and all questions were answered. The operative extremity was marked after confirmation with the patient. he was then brought back to the operating room by our anesthesia colleagues.  He was carefully transferred over to radiolucent flat top table.  He was placed under general anesthetic.  A bump was placed under his operative hip.   Extremity was then prepped and draped in usual sterile fashion.  Was performed to verify patient, the procedure, and the extremity.  Preoperative antibiotics were dosed. ? ?Direct anterior approach to the distal thigh and quadriceps tendon was made.  Carried it down through skin and subcutaneous tissue.  There is a thick area of scar tissue that was underneath the skin that adhered to the quadricep muscle underneath.  Opened this up to visualize the quadriceps tendon proper.  I then prepared the proximal portion of the patella with a ronguer to get the bone back to healthy bleeding tissue.  I then placed guidewires for the corkscrew anchors.  I drilled and tapped and then placed the anchors.  I then took 1 limb of each of the sutures and the anchor and ran it proximally distally with Krakow fashion throws.  The other set of sutures I through a modified Mason-Allen stitch at the periphery of the tendon.  I then brought through the other ends of the suture through the quad tendon.  Tension was applied to the Krak?w sutures and these were tied down.  I then tied down the dural and medial sutures at the periphery of the tendon. ? ?The peripheral sutures were then used to repair the layer of scar without any signs of through and through access to quad tendon.  The incision was copiously irrigated.  A gram of vancomycin powder was placed into the incision.  A layered closure 2-0 Vicryl 3-0 Monocryl and Steri-Strips was used to close the skin.  Sterile dressing was applied and he was placed back into a knee immobilizer.  The patient was awoken from anesthesia and  taken to the PACU in stable condition. ? ?Post Op Plan/Instructions: ?Patient be weightbearing as tolerated locked in extension in the immobilizer.  I will recommend no knee flexion for the first 2 weeks will receive aspirin for DVT.  We will follow-up as an outpatient and discharged from the PACU. ? ?I was present and performed the entire surgery. ? ?Ulyses Southward, PA-C did assist me throughout the case. An assistant was necessary given the difficulty in approach, maintenance of reduction and ability to instrument the fracture. ? ? ?Truitt Merle, MD ?[facility]  ?

## 2021-08-03 NOTE — Transfer of Care (Signed)
Immediate Anesthesia Transfer of Care Note ? ?Patient: Mark Cook ? ?Procedure(s) Performed: REPAIR QUADRICEP TENDON (Left: Leg Upper) ? ?Patient Location: PACU ? ?Anesthesia Type:GA combined with regional for post-op pain ? ?Level of Consciousness: drowsy ? ?Airway & Oxygen Therapy: Patient Spontanous Breathing and Patient connected to nasal cannula oxygen ? ?Post-op Assessment: Report given to RN and Post -op Vital signs reviewed and stable ? ?Post vital signs: Reviewed and stable, see PACU vital signs ? ?Last Vitals:  ?Vitals Value Taken Time  ?BP    ?Temp    ?Pulse    ?Resp    ?SpO2    ? ? ?Last Pain:  ?Vitals:  ? 08/03/21 0559  ?TempSrc: Oral  ?   ? ?  ? ?Complications: No notable events documented. ?

## 2021-08-03 NOTE — Anesthesia Procedure Notes (Signed)
Anesthesia Regional Block: Adductor canal block  ? ?Pre-Anesthetic Checklist: , timeout performed,  Correct Patient, Correct Site, Correct Laterality,  Correct Procedure,, site marked,  Risks and benefits discussed,  Surgical consent,  Pre-op evaluation,  At surgeon's request and post-op pain management ? ?Laterality: Left ? ?Prep: chloraprep     ?  ?Needles:  ?Injection technique: Single-shot ? ?Needle Type: Echogenic Stimulator Needle   ? ? ?Needle Length: 9cm  ?Needle Gauge: 21  ? ? ? ?Additional Needles: ? ? ?Procedures:,,,, ultrasound used (permanent image in chart),,    ?Narrative:  ?Start time: 08/03/2021 7:00 AM ?End time: 08/03/2021 7:10 AM ?Injection made incrementally with aspirations every 5 mL. ? ?Performed by: Personally  ?Anesthesiologist: Leonides Grills, MD ? ?Additional Notes: ?Functioning IV was confirmed and monitors were applied. A time-out was performed. Hand hygiene and sterile gloves were used. The thigh was placed in a frog-leg position and prepped in a sterile fashion. A 74mm 21ga Arrow echogenic stimulator needle was placed using ultrasound guidance.  Negative aspiration and negative test dose prior to incremental administration of local anesthetic. The patient tolerated the procedure well. ? ? ? ? ? ? ?

## 2021-08-03 NOTE — Discharge Instructions (Addendum)
? ?Orthopaedic Trauma Service Discharge Instructions ? ? ?General Discharge Instructions ? ?WEIGHT BEARING STATUS: Weightbearing as tolerated with knee fully extended in knee immobilizer  ? ?RANGE OF MOTION/ACTIVITY: Keep knee immobilizer on at all times, including when sleeping. No active or passive knee motion ? ?Wound Care:You may remove your surgical dressing on post-op day #2 (Wednesday 08/05/21). Leave yellow steri-strips in place over incision. Incisions can be left open to air if there is no drainage. If incision continues to have drainage, follow wound care instructions below. Okay to shower if no drainage from incisions. Keep knee in straightened position while showering ? ?DVT/PE prophylaxis: Aspirin 81 mg twice daily x 30 days. After 30 days you may resume Aspirin 81 mg once daily ? ?Diet: as you were eating previously.  Can use over the counter stool softeners and bowel preparations, such as Miralax, to help with bowel movements.  Narcotics can be constipating.  Be sure to drink plenty of fluids ? ?PAIN MEDICATION USE AND EXPECTATIONS ? You have likely been given narcotic medications to help control your pain.  After a traumatic event that results in an fracture (broken bone) with or without surgery, it is ok to use narcotic pain medications to help control one's pain.  We understand that everyone responds to pain differently and each individual patient will be evaluated on a regular basis for the continued need for narcotic medications. Ideally, narcotic medication use should last no more than 6-8 weeks (coinciding with fracture healing).  ? As a patient it is your responsibility as well to monitor narcotic medication use and report the amount and frequency you use these medications when you come to your office visit.  ? We would also advise that if you are using narcotic medications, you should take a dose prior to therapy to maximize you participation. ? ?IF YOU ARE ON NARCOTIC MEDICATIONS IT IS NOT  PERMISSIBLE TO OPERATE A MOTOR VEHICLE (MOTORCYCLE/CAR/TRUCK/MOPED) OR HEAVY MACHINERY ?DO NOT MIX NARCOTICS WITH OTHER CNS (CENTRAL NERVOUS SYSTEM) DEPRESSANTS SUCH AS ALCOHOL ? ? ?STOP SMOKING OR USING NICOTINE PRODUCTS!!!! ? As discussed nicotine severely impairs your body's ability to heal surgical and traumatic wounds but also impairs bone healing.  Wounds and bone heal by forming microscopic blood vessels (angiogenesis) and nicotine is a vasoconstrictor (essentially, shrinks blood vessels).  Therefore, if vasoconstriction occurs to these microscopic blood vessels they essentially disappear and are unable to deliver necessary nutrients to the healing tissue.  This is one modifiable factor that you can do to dramatically increase your chances of healing your injury.   ? (This means no smoking, no nicotine gum, patches, etc) ? ?DO NOT USE NONSTEROIDAL ANTI-INFLAMMATORY DRUGS (NSAID'S) ? Using products such as Advil (ibuprofen), Aleve (naproxen), Motrin (ibuprofen) for additional pain control during fracture healing can delay and/or prevent the healing response.  If you would like to take over the counter (OTC) medication, Tylenol (acetaminophen) is ok.  However, some narcotic medications that are given for pain control contain acetaminophen as well. Therefore, you should not exceed more than 4000 mg of tylenol in a day if you do not have liver disease.  Also note that there are may OTC medicines, such as cold medicines and allergy medicines that my contain tylenol as well.  If you have any questions about medications and/or interactions please ask your doctor/PA or your pharmacist.  ?   ? ?ICE AND ELEVATE INJURED/OPERATIVE EXTREMITY ? Using ice and elevating the injured extremity above your heart can help with  swelling and pain control.  Icing in a pulsatile fashion, such as 20 minutes on and 20 minutes off, can be followed.   ? Do not place ice directly on skin. Make sure there is a barrier between to skin and  the ice pack.   ? Using frozen items such as frozen peas works well as the conform nicely to the are that needs to be iced. ? ?USE AN ACE WRAP OR TED HOSE FOR SWELLING CONTROL ? In addition to icing and elevation, Ace wraps or TED hose are used to help limit and resolve swelling.  It is recommended to use Ace wraps or TED hose until you are informed to stop.   ? When using Ace Wraps start the wrapping distally (farthest away from the body) and wrap proximally (closer to the body) ?  Example: If you had surgery on your leg or thing and you do not have a splint on, start the ace wrap at the toes and work your way up to the thigh ?       If you had surgery on your upper extremity and do not have a splint on, start the ace wrap at your fingers and work your way up to the upper arm ? ? ? ?CALL THE OFFICE WITH ANY QUESTIONS OR CONCERNS: [phone]  ? ?VISIT OUR WEBSITE FOR ADDITIONAL INFORMATION: [email or web address] ?  ? ? ? ?Discharge Wound Care Instructions ? ?Do NOT apply any ointments, solutions or lotions to pin sites or surgical wounds.  These prevent needed drainage and even though solutions like hydrogen peroxide kill bacteria, they also damage cells lining the pin sites that help fight infection.  Applying lotions or ointments can keep the wounds moist and can cause them to breakdown and open up as well. This can increase the risk for infection. When in doubt call the office. ? ?Surgical incisions should be dressed daily. ? ?If any drainage is noted, use one layer of adaptic or Mepitel, then gauze, Kerlix, and an ace wrap. ?- These dressing supplies should be available at local medical supply stores [facility], [facility], etc) as well as Insurance claims handler ([facility], [facility], [employer], etc) ? ?Once the incision is completely dry and without drainage, it may be left open to air out.  Showering may begin 36-48 hours later.  Cleaning gently with soap and water. ? ? ?

## 2021-08-03 NOTE — Interval H&P Note (Signed)
History and Physical Interval Note: ? ?08/03/2021 ?7:20 AM ? ?Mark Cook  has presented today for surgery, with the diagnosis of Left quad tendon rupture.  The various methods of treatment have been discussed with the patient and family. After consideration of risks, benefits and other options for treatment, the patient has consented to  Procedure(s): ?REPAIR QUADRICEP TENDON (Left) as a surgical intervention.  The patient's history has been reviewed, patient examined, no change in status, stable for surgery.  I have reviewed the patient's chart and labs.  Questions were answered to the patient's satisfaction.   ? ? ?Caryn Bee P Cedra Villalon ? ? ?

## 2021-08-03 NOTE — Anesthesia Procedure Notes (Signed)
Procedure Name: LMA Insertion ?Date/Time: 08/03/2021 7:36 AM ?Performed by: Colin Benton, CRNA ?Pre-anesthesia Checklist: Patient identified, Emergency Drugs available, Suction available and Patient being monitored ?Patient Re-evaluated:Patient Re-evaluated prior to induction ?Oxygen Delivery Method: Circle system utilized ?Preoxygenation: Pre-oxygenation with 100% oxygen ?Induction Type: IV induction ?Ventilation: Mask ventilation without difficulty ?LMA: LMA inserted ?LMA Size: 5.0 ?Number of attempts: 1 ?Placement Confirmation: positive ETCO2 and breath sounds checked- equal and bilateral ?Tube secured with: Tape ?Dental Injury: Teeth and Oropharynx as per pre-operative assessment  ? ? ? ? ?

## 2021-08-04 ENCOUNTER — Encounter (HOSPITAL_COMMUNITY): Payer: Self-pay | Admitting: Student

## 2021-08-04 NOTE — Anesthesia Postprocedure Evaluation (Signed)
Anesthesia Post Note ? ?Patient: Mark Cook ? ?Procedure(s) Performed: REPAIR QUADRICEP TENDON (Left: Leg Upper) ? ?  ? ?Patient location during evaluation: PACU ?Anesthesia Type: General and Regional ?Level of consciousness: awake ?Pain management: pain level controlled ?Vital Signs Assessment: post-procedure vital signs reviewed and stable ?Respiratory status: spontaneous breathing, nonlabored ventilation, respiratory function stable and patient connected to nasal cannula oxygen ?Cardiovascular status: blood pressure returned to baseline and stable ?Postop Assessment: no apparent nausea or vomiting ?Anesthetic complications: no ? ? ?No notable events documented. ? ?Last Vitals:  ?Vitals:  ? 08/03/21 0942 08/03/21 0947  ?BP: (!) 159/100 (!) 167/99  ?Pulse: 63 74  ?Resp: 14 (!) 23  ?Temp:  36.4 ?C  ?SpO2: 98% 97%  ?  ?Last Pain:  ?Vitals:  ? 08/03/21 1015  ?TempSrc:   ?PainSc: 5   ? ? ?  ?  ?  ?  ?  ?  ? ?Christalynn Boise P Caiya Bettes ? ? ? ? ?
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
## 2021-08-22 ENCOUNTER — Ambulatory Visit: Payer: Medicare HMO | Attending: Family Medicine | Admitting: Physical Therapy

## 2021-08-22 DIAGNOSIS — M25562 Pain in left knee: Secondary | ICD-10-CM | POA: Insufficient documentation

## 2021-08-22 DIAGNOSIS — R2689 Other abnormalities of gait and mobility: Secondary | ICD-10-CM | POA: Diagnosis not present

## 2021-08-22 DIAGNOSIS — R2681 Unsteadiness on feet: Secondary | ICD-10-CM | POA: Insufficient documentation

## 2021-08-22 DIAGNOSIS — M545 Low back pain, unspecified: Secondary | ICD-10-CM | POA: Diagnosis not present

## 2021-08-22 DIAGNOSIS — M6281 Muscle weakness (generalized): Secondary | ICD-10-CM | POA: Insufficient documentation

## 2021-08-27 ENCOUNTER — Ambulatory Visit: Payer: Medicare HMO | Admitting: Physical Therapy

## 2021-08-27 ENCOUNTER — Encounter: Payer: Self-pay | Admitting: Physical Therapy

## 2021-08-27 DIAGNOSIS — M25562 Pain in left knee: Secondary | ICD-10-CM

## 2021-08-27 DIAGNOSIS — M545 Low back pain, unspecified: Secondary | ICD-10-CM | POA: Diagnosis not present

## 2021-08-27 DIAGNOSIS — R2681 Unsteadiness on feet: Secondary | ICD-10-CM | POA: Diagnosis not present

## 2021-08-27 DIAGNOSIS — M6281 Muscle weakness (generalized): Secondary | ICD-10-CM | POA: Diagnosis not present

## 2021-08-27 DIAGNOSIS — R2689 Other abnormalities of gait and mobility: Secondary | ICD-10-CM | POA: Diagnosis not present
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
## 2021-08-29 ENCOUNTER — Ambulatory Visit: Payer: Medicare HMO | Admitting: Physical Therapy

## 2021-09-01 DIAGNOSIS — S76102D Unspecified injury of left quadriceps muscle, fascia and tendon, subsequent encounter: Secondary | ICD-10-CM | POA: Diagnosis not present

## 2021-09-02 ENCOUNTER — Other Ambulatory Visit: Payer: Self-pay

## 2021-09-02 DIAGNOSIS — E119 Type 2 diabetes mellitus without complications: Secondary | ICD-10-CM

## 2021-09-03 ENCOUNTER — Ambulatory Visit: Payer: Medicare HMO | Admitting: Physical Therapy

## 2021-09-03 ENCOUNTER — Encounter: Payer: Self-pay | Admitting: Physical Therapy

## 2021-09-03 DIAGNOSIS — R2689 Other abnormalities of gait and mobility: Secondary | ICD-10-CM | POA: Diagnosis not present

## 2021-09-03 DIAGNOSIS — R2681 Unsteadiness on feet: Secondary | ICD-10-CM | POA: Diagnosis not present

## 2021-09-03 DIAGNOSIS — M6281 Muscle weakness (generalized): Secondary | ICD-10-CM

## 2021-09-03 DIAGNOSIS — M545 Low back pain, unspecified: Secondary | ICD-10-CM | POA: Diagnosis not present

## 2021-09-03 DIAGNOSIS — M25562 Pain in left knee: Secondary | ICD-10-CM

## 2021-09-03 MED ORDER — METFORMIN HCL 500 MG PO TABS: 500.0000 mg | ORAL_TABLET | Freq: Every day | ORAL | 3 refills | Status: DC
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
## 2021-09-05 ENCOUNTER — Ambulatory Visit: Payer: Medicare HMO | Admitting: Physical Therapy

## 2021-09-08 ENCOUNTER — Ambulatory Visit: Payer: Medicare HMO | Admitting: Physical Therapy

## 2021-09-11 ENCOUNTER — Ambulatory Visit: Payer: Medicare HMO | Attending: Family Medicine

## 2021-09-11 DIAGNOSIS — R293 Abnormal posture: Secondary | ICD-10-CM | POA: Insufficient documentation

## 2021-09-11 DIAGNOSIS — R2689 Other abnormalities of gait and mobility: Secondary | ICD-10-CM | POA: Insufficient documentation

## 2021-09-11 DIAGNOSIS — M545 Low back pain, unspecified: Secondary | ICD-10-CM | POA: Insufficient documentation

## 2021-09-11 DIAGNOSIS — M6281 Muscle weakness (generalized): Secondary | ICD-10-CM | POA: Diagnosis not present

## 2021-09-11 DIAGNOSIS — M25562 Pain in left knee: Secondary | ICD-10-CM | POA: Insufficient documentation

## 2021-09-11 DIAGNOSIS — R2681 Unsteadiness on feet: Secondary | ICD-10-CM | POA: Diagnosis not present
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
## 2021-09-16 ENCOUNTER — Encounter: Payer: Self-pay | Admitting: Physical Therapy

## 2021-09-16 ENCOUNTER — Ambulatory Visit: Payer: Medicare HMO | Admitting: Physical Therapy

## 2021-09-16 DIAGNOSIS — M6281 Muscle weakness (generalized): Secondary | ICD-10-CM | POA: Diagnosis not present

## 2021-09-16 DIAGNOSIS — M25562 Pain in left knee: Secondary | ICD-10-CM

## 2021-09-16 DIAGNOSIS — M545 Low back pain, unspecified: Secondary | ICD-10-CM

## 2021-09-16 DIAGNOSIS — R293 Abnormal posture: Secondary | ICD-10-CM | POA: Diagnosis not present

## 2021-09-16 DIAGNOSIS — R2681 Unsteadiness on feet: Secondary | ICD-10-CM

## 2021-09-16 DIAGNOSIS — R2689 Other abnormalities of gait and mobility: Secondary | ICD-10-CM | POA: Diagnosis not present
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
## 2021-09-17 ENCOUNTER — Other Ambulatory Visit: Payer: Self-pay

## 2021-09-18 MED ORDER — ATORVASTATIN CALCIUM 20 MG PO TABS: 20.0000 mg | ORAL_TABLET | Freq: Every day | ORAL | 3 refills | Status: DC

## 2021-09-19 ENCOUNTER — Ambulatory Visit: Payer: Medicare HMO | Admitting: Physical Therapy

## 2021-09-19 ENCOUNTER — Encounter: Payer: Self-pay | Admitting: Physical Therapy

## 2021-09-19 DIAGNOSIS — M545 Low back pain, unspecified: Secondary | ICD-10-CM | POA: Diagnosis not present

## 2021-09-19 DIAGNOSIS — R293 Abnormal posture: Secondary | ICD-10-CM | POA: Diagnosis not present

## 2021-09-19 DIAGNOSIS — M25562 Pain in left knee: Secondary | ICD-10-CM | POA: Diagnosis not present

## 2021-09-19 DIAGNOSIS — R2689 Other abnormalities of gait and mobility: Secondary | ICD-10-CM | POA: Diagnosis not present

## 2021-09-19 DIAGNOSIS — R2681 Unsteadiness on feet: Secondary | ICD-10-CM | POA: Diagnosis not present

## 2021-09-19 DIAGNOSIS — M6281 Muscle weakness (generalized): Secondary | ICD-10-CM
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
## 2021-09-24 ENCOUNTER — Ambulatory Visit: Payer: Medicare HMO | Admitting: Physical Therapy

## 2021-09-26 ENCOUNTER — Encounter: Payer: Self-pay | Admitting: Physical Therapy

## 2021-09-26 ENCOUNTER — Ambulatory Visit: Payer: Medicare HMO | Admitting: Physical Therapy

## 2021-09-26 DIAGNOSIS — R293 Abnormal posture: Secondary | ICD-10-CM

## 2021-09-26 DIAGNOSIS — R2689 Other abnormalities of gait and mobility: Secondary | ICD-10-CM

## 2021-09-26 DIAGNOSIS — M25562 Pain in left knee: Secondary | ICD-10-CM

## 2021-09-26 DIAGNOSIS — R2681 Unsteadiness on feet: Secondary | ICD-10-CM

## 2021-09-26 DIAGNOSIS — M545 Low back pain, unspecified: Secondary | ICD-10-CM | POA: Diagnosis not present

## 2021-09-26 DIAGNOSIS — M6281 Muscle weakness (generalized): Secondary | ICD-10-CM
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
## 2021-09-29 ENCOUNTER — Ambulatory Visit: Payer: Medicare HMO | Admitting: Physical Therapy

## 2021-09-29 ENCOUNTER — Telehealth: Payer: Self-pay | Admitting: Physical Therapy

## 2021-09-29 NOTE — Telephone Encounter (Signed)
Called and informed patient of missed visit and provided reminder of next appt and attendance policy.  

## 2021-10-01 ENCOUNTER — Ambulatory Visit: Payer: Medicare HMO | Admitting: Physical Therapy

## 2021-10-01 ENCOUNTER — Encounter: Payer: Self-pay | Admitting: Physical Therapy

## 2021-10-01 DIAGNOSIS — M6281 Muscle weakness (generalized): Secondary | ICD-10-CM | POA: Diagnosis not present

## 2021-10-01 DIAGNOSIS — R2681 Unsteadiness on feet: Secondary | ICD-10-CM | POA: Diagnosis not present

## 2021-10-01 DIAGNOSIS — M25562 Pain in left knee: Secondary | ICD-10-CM | POA: Diagnosis not present

## 2021-10-01 DIAGNOSIS — M545 Low back pain, unspecified: Secondary | ICD-10-CM | POA: Diagnosis not present

## 2021-10-01 DIAGNOSIS — R293 Abnormal posture: Secondary | ICD-10-CM | POA: Diagnosis not present

## 2021-10-01 DIAGNOSIS — R2689 Other abnormalities of gait and mobility: Secondary | ICD-10-CM | POA: Diagnosis not present
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
## 2021-10-10 ENCOUNTER — Ambulatory Visit: Payer: Medicare HMO | Attending: Family Medicine | Admitting: Physical Therapy

## 2021-10-10 ENCOUNTER — Encounter: Payer: Self-pay | Admitting: Physical Therapy

## 2021-10-10 DIAGNOSIS — M25562 Pain in left knee: Secondary | ICD-10-CM | POA: Diagnosis not present

## 2021-10-10 DIAGNOSIS — M545 Low back pain, unspecified: Secondary | ICD-10-CM | POA: Diagnosis not present

## 2021-10-10 DIAGNOSIS — M6281 Muscle weakness (generalized): Secondary | ICD-10-CM | POA: Diagnosis not present

## 2021-10-10 DIAGNOSIS — R2681 Unsteadiness on feet: Secondary | ICD-10-CM | POA: Insufficient documentation
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
## 2021-10-17 ENCOUNTER — Encounter: Payer: Self-pay | Admitting: Physical Therapy

## 2021-10-17 ENCOUNTER — Ambulatory Visit: Payer: Medicare HMO | Admitting: Physical Therapy

## 2021-10-17 DIAGNOSIS — M6281 Muscle weakness (generalized): Secondary | ICD-10-CM

## 2021-10-17 DIAGNOSIS — M545 Low back pain, unspecified: Secondary | ICD-10-CM

## 2021-10-17 DIAGNOSIS — M25562 Pain in left knee: Secondary | ICD-10-CM

## 2021-10-17 DIAGNOSIS — R2681 Unsteadiness on feet: Secondary | ICD-10-CM | POA: Diagnosis not present
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
## 2021-10-19 DIAGNOSIS — M2170 Unequal limb length (acquired), unspecified site: Secondary | ICD-10-CM | POA: Diagnosis not present

## 2021-10-21 ENCOUNTER — Encounter: Payer: Self-pay | Admitting: Physical Therapy

## 2021-10-21 ENCOUNTER — Ambulatory Visit: Payer: Medicare HMO | Admitting: Physical Therapy

## 2021-10-21 DIAGNOSIS — M6281 Muscle weakness (generalized): Secondary | ICD-10-CM

## 2021-10-21 DIAGNOSIS — R2681 Unsteadiness on feet: Secondary | ICD-10-CM | POA: Diagnosis not present

## 2021-10-21 DIAGNOSIS — M25562 Pain in left knee: Secondary | ICD-10-CM

## 2021-10-21 DIAGNOSIS — M545 Low back pain, unspecified: Secondary | ICD-10-CM
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
## 2021-10-24 ENCOUNTER — Ambulatory Visit: Payer: Medicare HMO | Admitting: Physical Therapy

## 2021-10-24 ENCOUNTER — Encounter: Payer: Self-pay | Admitting: Physical Therapy

## 2021-10-24 ENCOUNTER — Telehealth: Payer: Self-pay | Admitting: Physical Therapy

## 2021-10-24 DIAGNOSIS — M25562 Pain in left knee: Secondary | ICD-10-CM

## 2021-10-24 DIAGNOSIS — M6281 Muscle weakness (generalized): Secondary | ICD-10-CM

## 2021-10-24 DIAGNOSIS — M545 Low back pain, unspecified: Secondary | ICD-10-CM | POA: Diagnosis not present

## 2021-10-24 DIAGNOSIS — R2681 Unsteadiness on feet: Secondary | ICD-10-CM

## 2021-10-24 NOTE — Telephone Encounter (Signed)
VM full
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
## 2021-10-26 ENCOUNTER — Ambulatory Visit: Payer: Medicare HMO | Admitting: Physical Therapy

## 2021-10-28 ENCOUNTER — Ambulatory Visit: Payer: Medicare HMO | Admitting: Physical Therapy

## 2021-10-28 ENCOUNTER — Encounter: Payer: Self-pay | Admitting: Physical Therapy

## 2021-10-28 DIAGNOSIS — M6281 Muscle weakness (generalized): Secondary | ICD-10-CM

## 2021-10-28 DIAGNOSIS — M25562 Pain in left knee: Secondary | ICD-10-CM

## 2021-10-28 DIAGNOSIS — M545 Low back pain, unspecified: Secondary | ICD-10-CM

## 2021-10-28 DIAGNOSIS — R2681 Unsteadiness on feet: Secondary | ICD-10-CM
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
## 2021-11-03 ENCOUNTER — Ambulatory Visit: Payer: Medicare HMO

## 2021-11-03 DIAGNOSIS — M25562 Pain in left knee: Secondary | ICD-10-CM

## 2021-11-03 DIAGNOSIS — M6281 Muscle weakness (generalized): Secondary | ICD-10-CM

## 2021-11-03 DIAGNOSIS — M545 Low back pain, unspecified: Secondary | ICD-10-CM | POA: Diagnosis not present

## 2021-11-03 DIAGNOSIS — R2681 Unsteadiness on feet: Secondary | ICD-10-CM

## 2021-11-05 ENCOUNTER — Ambulatory Visit: Payer: Medicare HMO

## 2021-11-05 DIAGNOSIS — R2681 Unsteadiness on feet: Secondary | ICD-10-CM | POA: Diagnosis not present

## 2021-11-05 DIAGNOSIS — M545 Low back pain, unspecified: Secondary | ICD-10-CM | POA: Diagnosis not present

## 2021-11-05 DIAGNOSIS — M25562 Pain in left knee: Secondary | ICD-10-CM

## 2021-11-05 DIAGNOSIS — M6281 Muscle weakness (generalized): Secondary | ICD-10-CM
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
## 2021-11-18 DIAGNOSIS — M4180 Other forms of scoliosis, site unspecified: Secondary | ICD-10-CM | POA: Diagnosis not present

## 2021-11-18 DIAGNOSIS — M545 Low back pain, unspecified: Secondary | ICD-10-CM | POA: Diagnosis not present

## 2021-11-18 DIAGNOSIS — M5451 Vertebrogenic low back pain: Secondary | ICD-10-CM | POA: Diagnosis not present

## 2021-11-21 ENCOUNTER — Ambulatory Visit: Payer: Medicare HMO | Attending: Family Medicine | Admitting: Physical Therapy

## 2021-11-21 ENCOUNTER — Encounter: Payer: Self-pay | Admitting: Physical Therapy

## 2021-11-21 DIAGNOSIS — M545 Low back pain, unspecified: Secondary | ICD-10-CM | POA: Diagnosis not present

## 2021-11-21 DIAGNOSIS — M25562 Pain in left knee: Secondary | ICD-10-CM | POA: Diagnosis not present

## 2021-11-21 DIAGNOSIS — R293 Abnormal posture: Secondary | ICD-10-CM | POA: Diagnosis not present

## 2021-11-21 DIAGNOSIS — M6281 Muscle weakness (generalized): Secondary | ICD-10-CM | POA: Diagnosis not present

## 2021-11-21 DIAGNOSIS — R2689 Other abnormalities of gait and mobility: Secondary | ICD-10-CM | POA: Insufficient documentation

## 2021-11-21 DIAGNOSIS — R2681 Unsteadiness on feet: Secondary | ICD-10-CM | POA: Diagnosis not present
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
## 2021-11-26 ENCOUNTER — Ambulatory Visit: Payer: Medicare HMO | Admitting: Physical Therapy

## 2021-11-27 ENCOUNTER — Ambulatory Visit: Payer: Medicare HMO | Admitting: Physical Therapy

## 2021-11-27 ENCOUNTER — Encounter: Payer: Self-pay | Admitting: Physical Therapy

## 2021-11-27 DIAGNOSIS — M25562 Pain in left knee: Secondary | ICD-10-CM | POA: Diagnosis not present

## 2021-11-27 DIAGNOSIS — M545 Low back pain, unspecified: Secondary | ICD-10-CM | POA: Diagnosis not present

## 2021-11-27 DIAGNOSIS — M6281 Muscle weakness (generalized): Secondary | ICD-10-CM

## 2021-11-27 DIAGNOSIS — R2689 Other abnormalities of gait and mobility: Secondary | ICD-10-CM | POA: Diagnosis not present

## 2021-11-27 DIAGNOSIS — R2681 Unsteadiness on feet: Secondary | ICD-10-CM | POA: Diagnosis not present

## 2021-11-27 DIAGNOSIS — R293 Abnormal posture: Secondary | ICD-10-CM | POA: Diagnosis not present
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
## 2021-12-02 ENCOUNTER — Encounter: Payer: Self-pay | Admitting: Physical Therapy

## 2021-12-02 ENCOUNTER — Ambulatory Visit: Payer: Medicare HMO | Admitting: Physical Therapy

## 2021-12-02 DIAGNOSIS — R2689 Other abnormalities of gait and mobility: Secondary | ICD-10-CM | POA: Diagnosis not present

## 2021-12-02 DIAGNOSIS — M25562 Pain in left knee: Secondary | ICD-10-CM | POA: Diagnosis not present

## 2021-12-02 DIAGNOSIS — R2681 Unsteadiness on feet: Secondary | ICD-10-CM | POA: Diagnosis not present

## 2021-12-02 DIAGNOSIS — M545 Low back pain, unspecified: Secondary | ICD-10-CM | POA: Diagnosis not present

## 2021-12-02 DIAGNOSIS — M6281 Muscle weakness (generalized): Secondary | ICD-10-CM | POA: Diagnosis not present

## 2021-12-02 DIAGNOSIS — R293 Abnormal posture: Secondary | ICD-10-CM | POA: Diagnosis not present
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
## 2021-12-05 ENCOUNTER — Ambulatory Visit: Payer: Medicare HMO | Admitting: Physical Therapy

## 2021-12-05 DIAGNOSIS — M545 Low back pain, unspecified: Secondary | ICD-10-CM

## 2021-12-05 DIAGNOSIS — R293 Abnormal posture: Secondary | ICD-10-CM | POA: Diagnosis not present

## 2021-12-05 DIAGNOSIS — M6281 Muscle weakness (generalized): Secondary | ICD-10-CM | POA: Diagnosis not present

## 2021-12-05 DIAGNOSIS — R2681 Unsteadiness on feet: Secondary | ICD-10-CM | POA: Diagnosis not present

## 2021-12-05 DIAGNOSIS — R2689 Other abnormalities of gait and mobility: Secondary | ICD-10-CM | POA: Diagnosis not present

## 2021-12-05 DIAGNOSIS — M25562 Pain in left knee: Secondary | ICD-10-CM | POA: Diagnosis not present
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
## 2021-12-09 ENCOUNTER — Encounter: Payer: Self-pay | Admitting: Physical Therapy

## 2021-12-09 ENCOUNTER — Ambulatory Visit: Payer: Medicare HMO | Attending: Family Medicine | Admitting: Physical Therapy

## 2021-12-09 DIAGNOSIS — R2689 Other abnormalities of gait and mobility: Secondary | ICD-10-CM | POA: Insufficient documentation

## 2021-12-09 DIAGNOSIS — M25562 Pain in left knee: Secondary | ICD-10-CM | POA: Diagnosis not present

## 2021-12-09 DIAGNOSIS — R2681 Unsteadiness on feet: Secondary | ICD-10-CM | POA: Insufficient documentation

## 2021-12-09 DIAGNOSIS — R293 Abnormal posture: Secondary | ICD-10-CM | POA: Insufficient documentation

## 2021-12-09 DIAGNOSIS — M6281 Muscle weakness (generalized): Secondary | ICD-10-CM | POA: Insufficient documentation

## 2021-12-09 DIAGNOSIS — M545 Low back pain, unspecified: Secondary | ICD-10-CM | POA: Diagnosis not present
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
## 2021-12-11 ENCOUNTER — Other Ambulatory Visit: Payer: Self-pay | Admitting: Family Medicine

## 2021-12-11 DIAGNOSIS — Z89422 Acquired absence of other left toe(s): Secondary | ICD-10-CM

## 2021-12-12 ENCOUNTER — Ambulatory Visit: Payer: Medicare HMO | Admitting: Physical Therapy

## 2021-12-12 ENCOUNTER — Encounter: Payer: Self-pay | Admitting: Physical Therapy

## 2021-12-12 DIAGNOSIS — M25562 Pain in left knee: Secondary | ICD-10-CM

## 2021-12-12 DIAGNOSIS — M6281 Muscle weakness (generalized): Secondary | ICD-10-CM

## 2021-12-12 DIAGNOSIS — M545 Low back pain, unspecified: Secondary | ICD-10-CM | POA: Diagnosis not present

## 2021-12-12 DIAGNOSIS — R293 Abnormal posture: Secondary | ICD-10-CM | POA: Diagnosis not present

## 2021-12-12 DIAGNOSIS — R2681 Unsteadiness on feet: Secondary | ICD-10-CM

## 2021-12-12 DIAGNOSIS — R2689 Other abnormalities of gait and mobility: Secondary | ICD-10-CM | POA: Diagnosis not present
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
## 2021-12-15 ENCOUNTER — Other Ambulatory Visit: Payer: Self-pay | Admitting: Family Medicine

## 2021-12-15 DIAGNOSIS — N529 Male erectile dysfunction, unspecified: Secondary | ICD-10-CM

## 2021-12-16 ENCOUNTER — Ambulatory Visit: Payer: Medicare HMO | Admitting: Physical Therapy

## 2021-12-16 ENCOUNTER — Encounter: Payer: Self-pay | Admitting: Physical Therapy

## 2021-12-16 DIAGNOSIS — M545 Low back pain, unspecified: Secondary | ICD-10-CM | POA: Diagnosis not present

## 2021-12-16 DIAGNOSIS — R2681 Unsteadiness on feet: Secondary | ICD-10-CM

## 2021-12-16 DIAGNOSIS — M25562 Pain in left knee: Secondary | ICD-10-CM

## 2021-12-16 DIAGNOSIS — M6281 Muscle weakness (generalized): Secondary | ICD-10-CM

## 2021-12-16 DIAGNOSIS — R2689 Other abnormalities of gait and mobility: Secondary | ICD-10-CM | POA: Diagnosis not present

## 2021-12-16 DIAGNOSIS — R293 Abnormal posture: Secondary | ICD-10-CM | POA: Diagnosis not present
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
## 2021-12-19 ENCOUNTER — Encounter: Payer: Self-pay | Admitting: Physical Therapy

## 2021-12-19 ENCOUNTER — Ambulatory Visit: Payer: Medicare HMO | Admitting: Physical Therapy

## 2021-12-19 DIAGNOSIS — M6281 Muscle weakness (generalized): Secondary | ICD-10-CM

## 2021-12-19 DIAGNOSIS — R2681 Unsteadiness on feet: Secondary | ICD-10-CM | POA: Diagnosis not present

## 2021-12-19 DIAGNOSIS — R293 Abnormal posture: Secondary | ICD-10-CM | POA: Diagnosis not present

## 2021-12-19 DIAGNOSIS — M25562 Pain in left knee: Secondary | ICD-10-CM

## 2021-12-19 DIAGNOSIS — R2689 Other abnormalities of gait and mobility: Secondary | ICD-10-CM | POA: Diagnosis not present

## 2021-12-19 DIAGNOSIS — M545 Low back pain, unspecified: Secondary | ICD-10-CM | POA: Diagnosis not present
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
## 2021-12-23 ENCOUNTER — Ambulatory Visit: Payer: Medicare HMO | Admitting: Physical Therapy

## 2021-12-26 ENCOUNTER — Encounter: Payer: Self-pay | Admitting: Physical Therapy

## 2021-12-26 ENCOUNTER — Ambulatory Visit: Payer: Medicare HMO | Admitting: Physical Therapy

## 2021-12-26 DIAGNOSIS — R293 Abnormal posture: Secondary | ICD-10-CM | POA: Diagnosis not present

## 2021-12-26 DIAGNOSIS — M25562 Pain in left knee: Secondary | ICD-10-CM | POA: Diagnosis not present

## 2021-12-26 DIAGNOSIS — R2689 Other abnormalities of gait and mobility: Secondary | ICD-10-CM | POA: Diagnosis not present

## 2021-12-26 DIAGNOSIS — R2681 Unsteadiness on feet: Secondary | ICD-10-CM | POA: Diagnosis not present

## 2021-12-26 DIAGNOSIS — M6281 Muscle weakness (generalized): Secondary | ICD-10-CM | POA: Diagnosis not present

## 2021-12-26 DIAGNOSIS — M545 Low back pain, unspecified: Secondary | ICD-10-CM | POA: Diagnosis not present
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
## 2021-12-28 ENCOUNTER — Encounter: Payer: Self-pay | Admitting: Family Medicine

## 2021-12-28 ENCOUNTER — Ambulatory Visit (INDEPENDENT_AMBULATORY_CARE_PROVIDER_SITE_OTHER): Payer: Medicare HMO | Admitting: Family Medicine

## 2021-12-28 VITALS — BP 120/81 | HR 72 | Ht 76.0 in | Wt 242.5 lb

## 2021-12-28 DIAGNOSIS — M545 Low back pain, unspecified: Secondary | ICD-10-CM | POA: Diagnosis not present

## 2021-12-28 DIAGNOSIS — Z0000 Encounter for general adult medical examination without abnormal findings: Secondary | ICD-10-CM

## 2021-12-28 DIAGNOSIS — Z1211 Encounter for screening for malignant neoplasm of colon: Secondary | ICD-10-CM | POA: Diagnosis not present

## 2021-12-28 DIAGNOSIS — E1142 Type 2 diabetes mellitus with diabetic polyneuropathy: Secondary | ICD-10-CM | POA: Diagnosis not present

## 2021-12-28 DIAGNOSIS — E785 Hyperlipidemia, unspecified: Secondary | ICD-10-CM | POA: Diagnosis not present

## 2021-12-28 DIAGNOSIS — G8929 Other chronic pain: Secondary | ICD-10-CM | POA: Diagnosis not present

## 2021-12-28 NOTE — Assessment & Plan Note (Addendum)
Stable - recheck A1c today  - Urine microalbumin today

## 2021-12-28 NOTE — Assessment & Plan Note (Signed)
-   Lipid panel today - Continue Lipitor 20mg  for now

## 2021-12-28 NOTE — Progress Notes (Signed)
    SUBJECTIVE:   CHIEF COMPLAINT / HPI:   Back pain - Was previously in therapy and would like to resume (was paused due to knee pain) - Some difficult with walking but overall decent ROM - Previously had radiation into his left leg but not recently  - Needs brace for right foot, previously ordered  Colonoscopy  - Would like referral to GI for his colonoscopy  HLD - reports dizziness with his cholesterol medication - Is okay with getting lipid panel checked today  PERTINENT  PMH / PSH: Reviewed  OBJECTIVE:   BP 120/81 Comment: standing up  Pulse 72   Ht 6\' 4"  (1.93 m)   Wt 242 lb 8 oz (110 kg)   SpO2 96%   BMI 29.52 kg/m   General: NAD, well-appearing, well-nourished Respiratory: No respiratory distress, breathing comfortably, able to speak in full sentences Skin: warm and dry, no rashes noted on exposed skin Psych: Appropriate affect and mood MSK: mild gait abnormality favoring left side, paraspinal muscles nontender to palpation and no spinal tenderness. Noted amputation of left pinky toe  ASSESSMENT/PLAN:   Right lower back pain No red flag signs, examination largely benign. Patient interested in restarting his physical therapy, which was interrupted by a knee injury.  - referral to physical therapy placed  Type 2 diabetes mellitus (HCC) Stable - recheck A1c today  - Urine microalbumin today  Hyperlipidemia - Lipid panel today - Continue Lipitor 20mg  for now  Healthcare maintenance Referral to GI for colonoscopy placed     Celestia Duva, DO [facility]

## 2021-12-28 NOTE — Assessment & Plan Note (Signed)
Referral to GI for colonoscopy placed

## 2021-12-28 NOTE — Patient Instructions (Signed)
I put in a referral to physical therapy and the GI doctor for colonoscopy.  We are working on the order for your foot brace, hopefully will hear something back soon.  We are going to check your cholesterol and A1c today.  We also can get a urine check which will help [place] tell how your kidneys are doing.

## 2021-12-29 LAB — LIPID PANEL
Chol/HDL Ratio: 4 ratio (ref 0.0–5.0)
Cholesterol, Total: 187 mg/dL (ref 100–199)
HDL: 47 mg/dL (ref >39)
LDL Chol Calc (NIH): 93 mg/dL (ref 0–99)
Triglycerides: 279 mg/dL — ABNORMAL HIGH (ref 0–149)
VLDL Cholesterol Cal: 47 mg/dL — ABNORMAL HIGH (ref 5–40)

## 2021-12-29 LAB — HEMOGLOBIN A1C
Est. average glucose Bld gHb Est-mCnc: 128 mg/dL
Hgb A1c MFr Bld: 6.1 % — ABNORMAL HIGH (ref 4.8–5.6)

## 2021-12-29 LAB — MICROALBUMIN / CREATININE URINE RATIO
Creatinine, Urine: 118 mg/dL
Microalb/Creat Ratio: 4 mg/g creat (ref 0–29)
Microalbumin, Urine: 4.6 ug/mL

## 2021-12-30 ENCOUNTER — Encounter: Payer: Self-pay | Admitting: Family Medicine

## 2021-12-30 ENCOUNTER — Ambulatory Visit: Payer: Medicare HMO | Admitting: Physical Therapy

## 2021-12-30 ENCOUNTER — Encounter: Payer: Self-pay | Admitting: Physical Therapy

## 2021-12-30 ENCOUNTER — Telehealth: Payer: Self-pay | Admitting: Family Medicine

## 2021-12-30 DIAGNOSIS — M6281 Muscle weakness (generalized): Secondary | ICD-10-CM | POA: Diagnosis not present

## 2021-12-30 DIAGNOSIS — M545 Low back pain, unspecified: Secondary | ICD-10-CM

## 2021-12-30 DIAGNOSIS — R293 Abnormal posture: Secondary | ICD-10-CM | POA: Diagnosis not present

## 2021-12-30 DIAGNOSIS — M25562 Pain in left knee: Secondary | ICD-10-CM

## 2021-12-30 DIAGNOSIS — R2681 Unsteadiness on feet: Secondary | ICD-10-CM

## 2021-12-30 DIAGNOSIS — R2689 Other abnormalities of gait and mobility: Secondary | ICD-10-CM | POA: Diagnosis not present

## 2021-12-30 NOTE — Telephone Encounter (Signed)
Attempted to call patient regarding results but it went straight to voicemail and voicemail was full. Will send letter to patient with information.    Aniello Christopoulos, DO
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
## 2021-12-31 ENCOUNTER — Ambulatory Visit: Payer: Medicare HMO | Admitting: Physical Therapy

## 2021-12-31 ENCOUNTER — Encounter: Payer: Self-pay | Admitting: Physical Therapy

## 2021-12-31 DIAGNOSIS — M545 Low back pain, unspecified: Secondary | ICD-10-CM | POA: Diagnosis not present

## 2021-12-31 DIAGNOSIS — M25562 Pain in left knee: Secondary | ICD-10-CM

## 2021-12-31 DIAGNOSIS — R2681 Unsteadiness on feet: Secondary | ICD-10-CM | POA: Diagnosis not present

## 2021-12-31 DIAGNOSIS — R2689 Other abnormalities of gait and mobility: Secondary | ICD-10-CM

## 2021-12-31 DIAGNOSIS — R293 Abnormal posture: Secondary | ICD-10-CM

## 2021-12-31 DIAGNOSIS — M6281 Muscle weakness (generalized): Secondary | ICD-10-CM
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
## 2022-01-02 ENCOUNTER — Ambulatory Visit: Payer: Medicare HMO | Admitting: Physical Therapy

## 2022-01-06 ENCOUNTER — Ambulatory Visit: Payer: Medicare HMO | Admitting: Physical Therapy

## 2022-01-08 ENCOUNTER — Ambulatory Visit: Payer: Medicare HMO | Attending: Family Medicine | Admitting: Physical Therapy

## 2022-01-08 ENCOUNTER — Encounter: Payer: Self-pay | Admitting: Physical Therapy

## 2022-01-08 DIAGNOSIS — R2681 Unsteadiness on feet: Secondary | ICD-10-CM | POA: Insufficient documentation

## 2022-01-08 DIAGNOSIS — M6281 Muscle weakness (generalized): Secondary | ICD-10-CM | POA: Insufficient documentation

## 2022-01-08 DIAGNOSIS — M25562 Pain in left knee: Secondary | ICD-10-CM | POA: Diagnosis not present

## 2022-01-08 DIAGNOSIS — M545 Low back pain, unspecified: Secondary | ICD-10-CM | POA: Diagnosis not present
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
## 2022-01-20 ENCOUNTER — Ambulatory Visit: Payer: Medicare HMO | Admitting: Physical Therapy

## 2022-01-21 ENCOUNTER — Encounter: Payer: Self-pay | Admitting: Physical Therapy

## 2022-01-21 ENCOUNTER — Other Ambulatory Visit: Payer: Self-pay

## 2022-01-21 ENCOUNTER — Ambulatory Visit: Payer: Medicare HMO | Admitting: Physical Therapy

## 2022-01-21 DIAGNOSIS — R2681 Unsteadiness on feet: Secondary | ICD-10-CM | POA: Diagnosis not present

## 2022-01-21 DIAGNOSIS — M545 Low back pain, unspecified: Secondary | ICD-10-CM

## 2022-01-21 DIAGNOSIS — M6281 Muscle weakness (generalized): Secondary | ICD-10-CM

## 2022-01-21 DIAGNOSIS — M25562 Pain in left knee: Secondary | ICD-10-CM | POA: Diagnosis not present
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
## 2022-01-25 ENCOUNTER — Ambulatory Visit: Payer: Medicare HMO | Admitting: Physical Therapy
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
## 2022-01-27 ENCOUNTER — Ambulatory Visit: Payer: Medicare HMO | Admitting: Physical Therapy

## 2022-02-04 ENCOUNTER — Encounter: Payer: Self-pay | Admitting: Physical Therapy

## 2022-02-04 ENCOUNTER — Ambulatory Visit: Payer: Medicare HMO | Admitting: Physical Therapy

## 2022-02-04 DIAGNOSIS — M25562 Pain in left knee: Secondary | ICD-10-CM | POA: Diagnosis not present

## 2022-02-04 DIAGNOSIS — R2681 Unsteadiness on feet: Secondary | ICD-10-CM

## 2022-02-04 DIAGNOSIS — M545 Low back pain, unspecified: Secondary | ICD-10-CM

## 2022-02-04 DIAGNOSIS — M6281 Muscle weakness (generalized): Secondary | ICD-10-CM
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
## 2022-02-06 ENCOUNTER — Encounter: Payer: Self-pay | Admitting: Physical Therapy

## 2022-02-06 ENCOUNTER — Ambulatory Visit: Payer: Medicare HMO | Admitting: Physical Therapy

## 2022-02-06 DIAGNOSIS — R2681 Unsteadiness on feet: Secondary | ICD-10-CM | POA: Diagnosis not present

## 2022-02-06 DIAGNOSIS — M25562 Pain in left knee: Secondary | ICD-10-CM | POA: Diagnosis not present

## 2022-02-06 DIAGNOSIS — M545 Low back pain, unspecified: Secondary | ICD-10-CM

## 2022-02-06 DIAGNOSIS — M6281 Muscle weakness (generalized): Secondary | ICD-10-CM
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
## 2022-02-10 ENCOUNTER — Ambulatory Visit: Payer: Medicare HMO | Attending: Family Medicine | Admitting: Physical Therapy

## 2022-02-10 ENCOUNTER — Encounter: Payer: Self-pay | Admitting: Physical Therapy

## 2022-02-10 DIAGNOSIS — R293 Abnormal posture: Secondary | ICD-10-CM | POA: Insufficient documentation

## 2022-02-10 DIAGNOSIS — M25562 Pain in left knee: Secondary | ICD-10-CM | POA: Insufficient documentation

## 2022-02-10 DIAGNOSIS — M6281 Muscle weakness (generalized): Secondary | ICD-10-CM | POA: Diagnosis not present

## 2022-02-10 DIAGNOSIS — M545 Low back pain, unspecified: Secondary | ICD-10-CM | POA: Diagnosis not present

## 2022-02-10 DIAGNOSIS — R2689 Other abnormalities of gait and mobility: Secondary | ICD-10-CM | POA: Insufficient documentation

## 2022-02-10 DIAGNOSIS — R2681 Unsteadiness on feet: Secondary | ICD-10-CM | POA: Insufficient documentation
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
## 2022-02-13 ENCOUNTER — Ambulatory Visit: Payer: Medicare HMO | Admitting: Physical Therapy

## 2022-02-13 ENCOUNTER — Encounter: Payer: Self-pay | Admitting: Physical Therapy

## 2022-02-13 DIAGNOSIS — M6281 Muscle weakness (generalized): Secondary | ICD-10-CM | POA: Diagnosis not present

## 2022-02-13 DIAGNOSIS — R2681 Unsteadiness on feet: Secondary | ICD-10-CM | POA: Diagnosis not present

## 2022-02-13 DIAGNOSIS — M545 Low back pain, unspecified: Secondary | ICD-10-CM | POA: Diagnosis not present

## 2022-02-13 DIAGNOSIS — M25562 Pain in left knee: Secondary | ICD-10-CM | POA: Diagnosis not present

## 2022-02-13 DIAGNOSIS — R293 Abnormal posture: Secondary | ICD-10-CM

## 2022-02-13 DIAGNOSIS — R2689 Other abnormalities of gait and mobility: Secondary | ICD-10-CM
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
## 2022-02-15 ENCOUNTER — Ambulatory Visit: Payer: Medicare HMO | Admitting: Physical Therapy

## 2022-02-17 ENCOUNTER — Encounter: Payer: Self-pay | Admitting: Physical Therapy

## 2022-02-17 ENCOUNTER — Ambulatory Visit: Payer: Medicare HMO | Admitting: Physical Therapy

## 2022-02-17 DIAGNOSIS — M545 Low back pain, unspecified: Secondary | ICD-10-CM | POA: Diagnosis not present

## 2022-02-17 DIAGNOSIS — R2681 Unsteadiness on feet: Secondary | ICD-10-CM

## 2022-02-17 DIAGNOSIS — R293 Abnormal posture: Secondary | ICD-10-CM

## 2022-02-17 DIAGNOSIS — M6281 Muscle weakness (generalized): Secondary | ICD-10-CM | POA: Diagnosis not present

## 2022-02-17 DIAGNOSIS — R2689 Other abnormalities of gait and mobility: Secondary | ICD-10-CM

## 2022-02-17 DIAGNOSIS — M25562 Pain in left knee: Secondary | ICD-10-CM | POA: Diagnosis not present
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
## 2022-02-24 ENCOUNTER — Encounter: Payer: Self-pay | Admitting: Physical Therapy

## 2022-02-24 ENCOUNTER — Ambulatory Visit: Payer: Medicare HMO | Admitting: Physical Therapy

## 2022-02-24 DIAGNOSIS — M25562 Pain in left knee: Secondary | ICD-10-CM

## 2022-02-24 DIAGNOSIS — M545 Low back pain, unspecified: Secondary | ICD-10-CM

## 2022-02-24 DIAGNOSIS — R2689 Other abnormalities of gait and mobility: Secondary | ICD-10-CM | POA: Diagnosis not present

## 2022-02-24 DIAGNOSIS — M6281 Muscle weakness (generalized): Secondary | ICD-10-CM | POA: Diagnosis not present

## 2022-02-24 DIAGNOSIS — R2681 Unsteadiness on feet: Secondary | ICD-10-CM | POA: Diagnosis not present

## 2022-02-24 DIAGNOSIS — R293 Abnormal posture: Secondary | ICD-10-CM | POA: Diagnosis not present
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
## 2022-03-02 ENCOUNTER — Encounter: Payer: Self-pay | Admitting: Physical Therapy

## 2022-03-02 ENCOUNTER — Ambulatory Visit: Payer: Medicare HMO | Admitting: Physical Therapy

## 2022-03-02 DIAGNOSIS — M6281 Muscle weakness (generalized): Secondary | ICD-10-CM

## 2022-03-02 DIAGNOSIS — M545 Low back pain, unspecified: Secondary | ICD-10-CM | POA: Diagnosis not present

## 2022-03-02 DIAGNOSIS — R2689 Other abnormalities of gait and mobility: Secondary | ICD-10-CM | POA: Diagnosis not present

## 2022-03-02 DIAGNOSIS — M25562 Pain in left knee: Secondary | ICD-10-CM

## 2022-03-02 DIAGNOSIS — R2681 Unsteadiness on feet: Secondary | ICD-10-CM | POA: Diagnosis not present

## 2022-03-02 DIAGNOSIS — R293 Abnormal posture: Secondary | ICD-10-CM | POA: Diagnosis not present
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
## 2022-03-09 ENCOUNTER — Ambulatory Visit: Payer: Medicare HMO | Admitting: Physical Therapy

## 2022-03-10 ENCOUNTER — Ambulatory Visit: Payer: Medicare HMO | Admitting: Physical Therapy

## 2022-03-17 ENCOUNTER — Encounter: Payer: Self-pay | Admitting: Physical Therapy

## 2022-03-17 ENCOUNTER — Ambulatory Visit: Payer: Medicare HMO | Attending: Family Medicine | Admitting: Physical Therapy

## 2022-03-17 DIAGNOSIS — M25562 Pain in left knee: Secondary | ICD-10-CM | POA: Insufficient documentation

## 2022-03-17 DIAGNOSIS — M6281 Muscle weakness (generalized): Secondary | ICD-10-CM | POA: Diagnosis not present

## 2022-03-17 DIAGNOSIS — R2681 Unsteadiness on feet: Secondary | ICD-10-CM | POA: Insufficient documentation

## 2022-03-17 DIAGNOSIS — M545 Low back pain, unspecified: Secondary | ICD-10-CM | POA: Diagnosis not present
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
## 2022-03-26 ENCOUNTER — Encounter: Payer: Self-pay | Admitting: Gastroenterology

## 2022-04-28 ENCOUNTER — Ambulatory Visit (AMBULATORY_SURGERY_CENTER): Payer: Self-pay

## 2022-04-28 VITALS — Ht 76.0 in | Wt 230.0 lb

## 2022-04-28 DIAGNOSIS — Z1211 Encounter for screening for malignant neoplasm of colon: Secondary | ICD-10-CM

## 2022-04-28 MED ORDER — NA SULFATE-K SULFATE-MG SULF 17.5-3.13-1.6 GM/177ML PO SOLN: 1.0000 | Freq: Once | ORAL | 0 refills | Status: AC

## 2022-04-28 NOTE — Progress Notes (Signed)

## 2022-05-19 ENCOUNTER — Encounter: Payer: Self-pay | Admitting: Gastroenterology

## 2022-05-21 ENCOUNTER — Encounter: Payer: Medicare HMO | Admitting: Gastroenterology

## 2022-05-21 ENCOUNTER — Telehealth: Payer: Self-pay | Admitting: Gastroenterology

## 2022-05-21 NOTE — Telephone Encounter (Signed)
I will forward to [facility] to be charged a no show fee. As of yet he has not rescheduled

## 2022-05-21 NOTE — Telephone Encounter (Signed)
Called patient today regarding his procedure appointment. Patient states he didn't prep due to prep medication was expensive and didn't receive his prep instruction. Patient states he would like a nurse to call him regarding prep instruction and possibly a coupon for his prep medication. I will no show him for today.

## 2022-05-21 NOTE — Telephone Encounter (Signed)
Thanks for letting me know. This is unfortunate. He should have called sooner if he had issued with this.  Mark Cook he should be charged a no-show fee. Needs to be rescheduled, maybe we can give him a prep. He needs to also understand that failure to call to cancel results in a fee.

## 2022-06-07 ENCOUNTER — Other Ambulatory Visit: Payer: Self-pay | Admitting: Family Medicine

## 2022-06-07 DIAGNOSIS — N529 Male erectile dysfunction, unspecified: Secondary | ICD-10-CM

## 2022-06-29 ENCOUNTER — Encounter: Payer: Medicare HMO | Admitting: Student

## 2022-07-05 ENCOUNTER — Encounter: Payer: Self-pay | Admitting: Family Medicine

## 2022-07-05 ENCOUNTER — Ambulatory Visit (INDEPENDENT_AMBULATORY_CARE_PROVIDER_SITE_OTHER): Payer: Medicare HMO | Admitting: Family Medicine

## 2022-07-05 VITALS — BP 117/75 | HR 71 | Ht 76.0 in | Wt 244.6 lb

## 2022-07-05 DIAGNOSIS — Z1211 Encounter for screening for malignant neoplasm of colon: Secondary | ICD-10-CM | POA: Diagnosis not present

## 2022-07-05 DIAGNOSIS — E785 Hyperlipidemia, unspecified: Secondary | ICD-10-CM

## 2022-07-05 DIAGNOSIS — E1142 Type 2 diabetes mellitus with diabetic polyneuropathy: Secondary | ICD-10-CM

## 2022-07-05 DIAGNOSIS — Z0100 Encounter for examination of eyes and vision without abnormal findings: Secondary | ICD-10-CM

## 2022-07-05 DIAGNOSIS — Z89432 Acquired absence of left foot: Secondary | ICD-10-CM | POA: Diagnosis not present

## 2022-07-05 LAB — POCT GLYCOSYLATED HEMOGLOBIN (HGB A1C): HbA1c, POC (controlled diabetic range): 5.8 % (ref 0.0–7.0)

## 2022-07-05 MED ORDER — ATORVASTATIN CALCIUM 10 MG PO TABS: 10.0000 mg | ORAL_TABLET | Freq: Every day | ORAL | 3 refills | Status: DC

## 2022-07-05 NOTE — Progress Notes (Signed)
    SUBJECTIVE:   CHIEF COMPLAINT / HPI:   Patient for history of Type 2 DM presents for follow up. Last A1c 6 months ago was 6.1. Complaint on metformin 500 mg daily, started this 3 years ago. When asked, he reports that he would be happy to stop his metformin if he does not need it anymore. Last visit to the eye doctor was 8-9 years ago. Denies any other concerns at this time. He was prescribed a statin initially and said that he stopped taking it since it did not help him.   OBJECTIVE:   BP 117/75   Pulse 71   Ht 6' 4"$  (1.93 m)   Wt 244 lb 9.6 oz (110.9 kg)   SpO2 98%   BMI 29.77 kg/m   General: Patient well-appearing, in no acute distress. CV: RRR, no murmurs or gallops auscultated Resp: CTAB, no wheezing, rales or rhonchi noted Abdomen: soft, nontender, nondistended, presence of bowel sounds Psych: mood appropriate, pleasant   ASSESSMENT/PLAN:   Type 2 diabetes mellitus (HCC) -A1c 5.8, at goal. Congratulated patient on this wonderful achievement -emphasized importance of lifestyle modifications -will discontinue metformin given patient has maintain stable A1c for years -discussed importance of annual eye exams, ophthalmology referral placed -follow up in 3 months for repeat A1c, consider adding back metformin if indicated  Hyperlipidemia -discussed importance of statin therapy for stroke prevention and cardiac benefit in the setting of DM  -patient later agreed to restart statin but requests a lower dose, prescribed atorvastatin 10 mg daily -lifestyle modifications   Health maintenance -GI referral placed for colonoscopy screening. -Med rec reviewed and updated appropriately.     Donney Dice, [place]

## 2022-07-05 NOTE — Assessment & Plan Note (Signed)
-  A1c 5.8, at goal. Congratulated patient on this wonderful achievement -emphasized importance of lifestyle modifications -will discontinue metformin given patient has maintain stable A1c for years -discussed importance of annual eye exams, ophthalmology referral placed -follow up in 3 months for repeat A1c, consider adding back metformin if indicated

## 2022-07-05 NOTE — Assessment & Plan Note (Signed)
-  discussed importance of statin therapy for stroke prevention and cardiac benefit in the setting of DM  -patient later agreed to restart statin but requests a lower dose, prescribed atorvastatin 10 mg daily -lifestyle modifications

## 2022-07-05 NOTE — Patient Instructions (Signed)
It was great seeing you today!  Today we discussed your diabetes, your A1c is 5.8! This is great and at the goal where we would want it. We will stop the metformin.   It is important to get your eyes checked by the ophthalmologist at least once a year, I have placed a referral to the eye doctor. Please schedule an appointment at your convenience when they reach out to you.  I have also placed a referral to the GI specialist for a colonoscopy.   Please follow up at your next scheduled appointment in 3 months diabetes follow up, if anything arises between now and then, please don't hesitate to contact our office.   Thank you for allowing [place] to be a part of your medical care!  Thank you, Dr. Larae Grooms  Also a reminder of our clinic's no-show policy. Please make sure to arrive at least 15 minutes prior to your scheduled appointment time. Please try to cancel before 24 hours if you are not able to make it. If you no-show for 2 appointments then you will be receiving a warning letter. If you no-show after 3 visits, then you may be at risk of being dismissed from our clinic. This is to ensure that everyone is able to be seen in a timely manner. Thank you, we appreciate your assistance with this!

## 2022-07-28 ENCOUNTER — Other Ambulatory Visit: Payer: Self-pay | Admitting: Orthopaedic Surgery

## 2022-07-28 DIAGNOSIS — M25562 Pain in left knee: Secondary | ICD-10-CM

## 2022-09-02 ENCOUNTER — Other Ambulatory Visit: Payer: Self-pay | Admitting: Family Medicine

## 2022-09-02 DIAGNOSIS — E119 Type 2 diabetes mellitus without complications: Secondary | ICD-10-CM

## 2022-10-05 ENCOUNTER — Ambulatory Visit: Payer: Medicare HMO | Admitting: Family Medicine

## 2022-10-12 ENCOUNTER — Ambulatory Visit (INDEPENDENT_AMBULATORY_CARE_PROVIDER_SITE_OTHER): Payer: Medicare HMO | Admitting: Student

## 2022-10-12 ENCOUNTER — Ambulatory Visit: Payer: Medicare HMO | Admitting: Family Medicine

## 2022-10-12 VITALS — BP 120/62 | HR 78 | Ht 76.0 in | Wt 245.0 lb

## 2022-10-12 DIAGNOSIS — M25562 Pain in left knee: Secondary | ICD-10-CM

## 2022-10-12 DIAGNOSIS — M1712 Unilateral primary osteoarthritis, left knee: Secondary | ICD-10-CM | POA: Diagnosis not present

## 2022-10-12 DIAGNOSIS — K625 Hemorrhage of anus and rectum: Secondary | ICD-10-CM | POA: Diagnosis not present

## 2022-10-12 DIAGNOSIS — G8929 Other chronic pain: Secondary | ICD-10-CM | POA: Diagnosis not present

## 2022-10-12 DIAGNOSIS — K921 Melena: Secondary | ICD-10-CM

## 2022-10-12 MED ORDER — METHYLPREDNISOLONE ACETATE 40 MG/ML IJ SUSP
40.0000 mg | Freq: Once | INTRAMUSCULAR | Status: AC
Start: 2022-10-12 — End: 2022-10-12
  Administered 2022-10-12: 40 mg via INTRA_ARTICULAR

## 2022-10-12 NOTE — Assessment & Plan Note (Signed)
Has done quite well recovering from his quadriceps tendon rupture and subsequent surgery, however his underlying arthritis continues to be an issue.  Discussed treatment options including corticosteroid injection and physical therapy.  Also discussed that this may progress to the point of needing a knee replacement in the future.  He is open to all options to try and put off another surgery. Risks and benefits of the injection discussed. Do note history of quadriceps tendon repair 1 year ago, though given time since operation, do not expect this to cause any complications.  Patient elected to proceed. - Referral to PT for Knee rehab - Written informed consent obtained.  A steroid injection was performed using a medial approach to the L knee using 4mL 1% plain Lidocaine and 40 mg of Depo-medrol. This was well tolerated.

## 2022-10-12 NOTE — Patient Instructions (Addendum)
Please call Dr. Lanetta Inch office back and get a new colonoscopy date on the books ASAP. When you talk to them, be sure to mention the cost issues with the prep. Their office number is [phone].   We injected your knee with both a numbing medicine and a steroid.  The night medicine should start working right away, the steroid can take a few days to really kick in.  Hopefully this will be 2 to 3 months or so of pain relief.  We can repeat this in 3 months if he found it helpful.  Ultimately treatment for this knee is going to be physical therapy and possibly down the line a knee replacement with  Dr. Jena Gauss and your orthopedic group.  The PT folks will reach out to get that set up--be expecting a call from a number you don't recognize.  Eliezer Mccoy, MD

## 2022-10-12 NOTE — Assessment & Plan Note (Addendum)
Transient issue, now resolved. Agree with patient that this was likely caused by/exacerbated by his heavy NSAID use.  Possibility of underlying hemorrhoids vs lower GI lesion. Deferred rectal exam today per patient preference and understanding that colonoscopy will include thorough rectal exam. Agree with discontinuation of NSAIDs.  Very much needs a colonoscopy.  He voices motivation to call Dr. Lanetta Inch office today to get this rescheduled ASAP.  - CBC, iron panel - Cessation of NSAIDs - Colonoscopy with Dr. Adela Lank

## 2022-10-12 NOTE — Progress Notes (Signed)
    SUBJECTIVE:   CHIEF COMPLAINT / HPI:   Knee pain Chronic issue.  Stiffness pain and swelling in the setting of known osteoarthritis.  X-Osmany of the knee in March 2023 after a fall showing moderate tricompartmental osteoarthritis with small joint effusion.  Ultrasound obtained later that month that showed a full-thickness rupture of the quadriceps tendon and he subsequently had a quadriceps tendon repair with Dr. Jena Gauss.  Did a full course of physical therapy after surgery. Here requesting a corticosteroid injection of the knee.  Tells me that he has had these in our office in the past but most recent was several years ago. (2024 by review of records).   Blood in stool Transient ~1-2 weeks of relatively small volume BRBPR. He attributed this to overuse of NSAIDs in the setting of his Knee OA. Was taking 1200mg  of ibuprofen daily in divided doses. Bleeding stopped when he stopped the ibuprofen.  Was to have a colonoscopy in January but did not follow through on this due to the prep medicine being unaffordable for him.  By review of records it appears attempts to get a colonoscopy since at least 2020 and an attempt for Cologuard in 2022 that was not followed through on. Has never had a colonoscopy.  PERTINENT  PMH / PSH: History of excess alcohol use, diabetes, history of subacute osteo myelitis of the left foot  OBJECTIVE:   BP 120/62   Pulse 78   Ht 6\' 4"  (1.93 m)   Wt 245 lb (111.1 kg)   SpO2 98%   BMI 29.82 kg/m   Gen: In good spirits, walkign with assistance of cane Abd: Non-tender, non-distended  MSK: L knee with surgical scar, no erythema or obvious deformity. ++joint line pain and small effusion in suprapatellar area. FROM in flexion and extension with intermittent crepitus.   GU: deferred per patient preference  ASSESSMENT/PLAN:   Osteoarthritis of left knee Has done quite well recovering from his quadriceps tendon rupture and subsequent surgery, however his underlying  arthritis continues to be an issue.  Discussed treatment options including corticosteroid injection and physical therapy.  Also discussed that this may progress to the point of needing a knee replacement in the future.  He is open to all options to try and put off another surgery. Risks and benefits of the injection discussed. Do note history of quadriceps tendon repair 1 year ago, though given time since operation, do not expect this to cause any complications.  Patient elected to proceed. - Referral to PT for Knee rehab - Written informed consent obtained.  A steroid injection was performed using a medial approach to the L knee using 4mL 1% plain Lidocaine and 40 mg of Depo-medrol. This was well tolerated.   Blood in stool Transient issue, now resolved. Agree with patient that this was likely caused by/exacerbated by his heavy NSAID use.  Possibility of underlying hemorrhoids vs lower GI lesion. Deferred rectal exam today per patient preference and understanding that colonoscopy will include thorough rectal exam. Agree with discontinuation of NSAIDs.  Very much needs a colonoscopy.  He voices motivation to call Dr. Lanetta Inch office today to get this rescheduled ASAP.  - CBC, iron panel - Cessation of NSAIDs - Colonoscopy with Dr. Shon Hough, MD [facility]

## 2022-10-13 LAB — CBC
Hematocrit: 40.4 % (ref 37.5–51.0)
Hemoglobin: 13.9 g/dL (ref 13.0–17.7)
MCH: 31 pg (ref 26.6–33.0)
MCHC: 34.4 g/dL (ref 31.5–35.7)
MCV: 90 fL (ref 79–97)
Platelets: 165 10*3/uL (ref 150–450)
RBC: 4.49 x10E6/uL (ref 4.14–5.80)
RDW: 13.5 % (ref 11.6–15.4)
WBC: 4.9 10*3/uL (ref 3.4–10.8)

## 2022-10-13 LAB — IRON,TIBC AND FERRITIN PANEL
Ferritin: 348 ng/mL (ref 30–400)
Iron Saturation: 21 % (ref 15–55)
Iron: 70 ug/dL (ref 38–169)
Total Iron Binding Capacity: 328 ug/dL (ref 250–450)
UIBC: 258 ug/dL (ref 111–343)

## 2022-10-15 ENCOUNTER — Encounter: Payer: Self-pay | Admitting: Student

## 2023-02-09 IMAGING — CT CT HEART MORP W/ CTA COR W/ SCORE W/ CA W/CM &/OR W/O CM
4 of 7 series · 8 of 20 positions shown, 9 images · IV contrast (APPLIED)
Comparison: None.
COMPARISON: None.

Addendum:
EXAM:
OVER-READ INTERPRETATION  CT CHEST

The following report is an over-read performed by radiologist Dr.
Chantouze Rought [REDACTED] on 01/22/2021. This
over-read does not include interpretation of cardiac or coronary
anatomy or pathology. The coronary CTA interpretation by the
cardiologist is attached.
HISTORY: Chest pain/anginal equiv, ECGs and troponins normal
Cardiac/Coronary  CT
TECHNIQUE: The patient was scanned on a Siemens Force scanner.
PROTOCOL: A 120 kV prospective scan was triggered in the descending thoracic
aorta at 111 HU's. Axial non-contrast 3 mm slices were carried out
through the heart. The data set was analyzed on a dedicated work
station and scored using the Agatston method. Gantry rotation speed
was 250 msecs and collimation was .6 mm. Beta blockade and 0.8 mg of
sl NTG was given. The 3D data set was reconstructed in 5% intervals
of the 35-75 % of the R-R cycle. Systolic and diastolic phases were
analyzed on a dedicated work station using MPR, MIP and VRT modes.
The patient received 95mL OMNIPAQUE IOHEXOL 350 MG/ML SOLN contrast.

[Series 6: ts diast sharp · axial · 0.39mm/px · z∈[+1418,+1459]mm · 2 of 303 slices shown]
[im 101/303  lung]
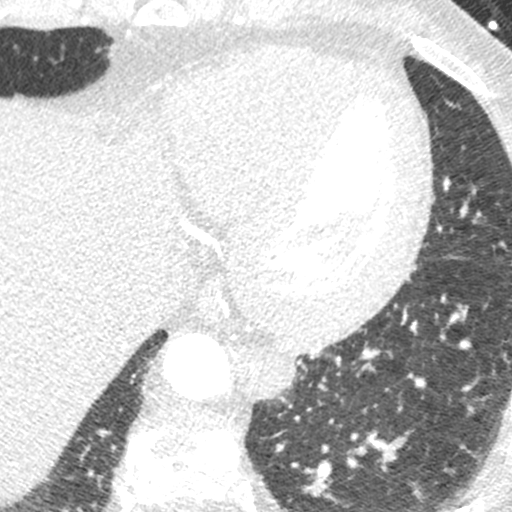
[im 202/303  lung]
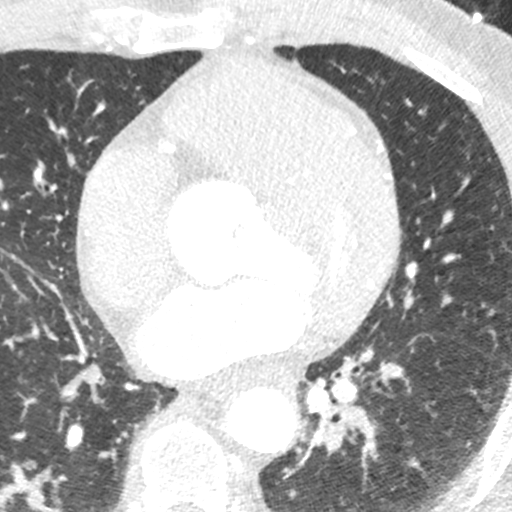

[Series 7: best diast · axial · 0.39mm/px · z∈[+1418,+1459]mm · 2 of 303 slices shown, 3 images]
[im 101/303  vessel]
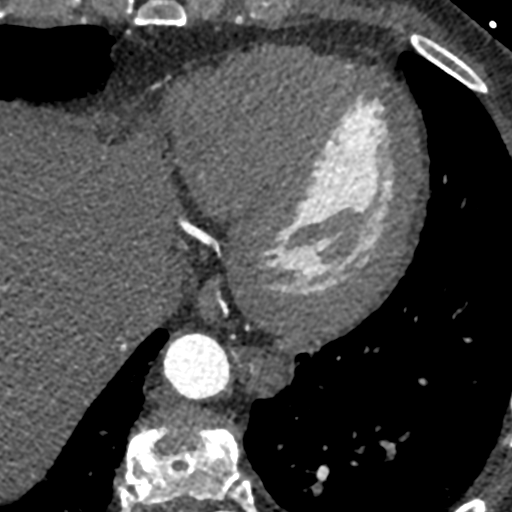
[im 101/303  lung]
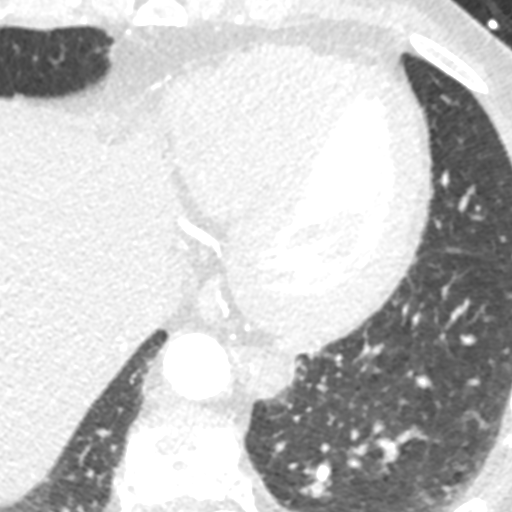
[im 202/303  vessel]
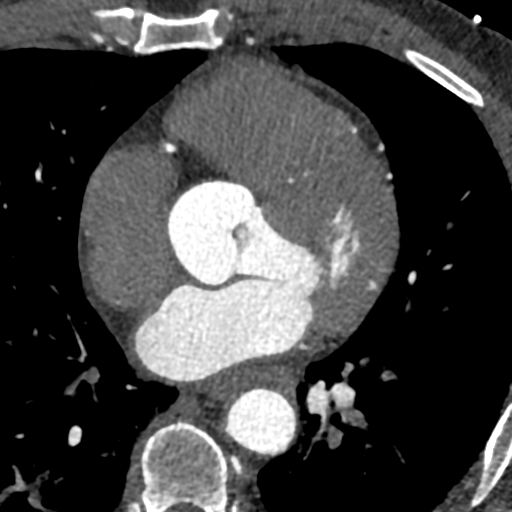

[Series 8: best syst · axial · 0.39mm/px · z∈[+1418,+1459]mm · 2 of 303 slices shown]
[im 101/303  vessel]
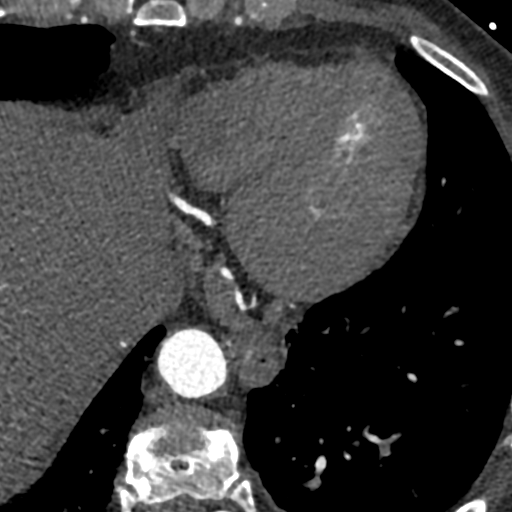
[im 202/303  vessel]
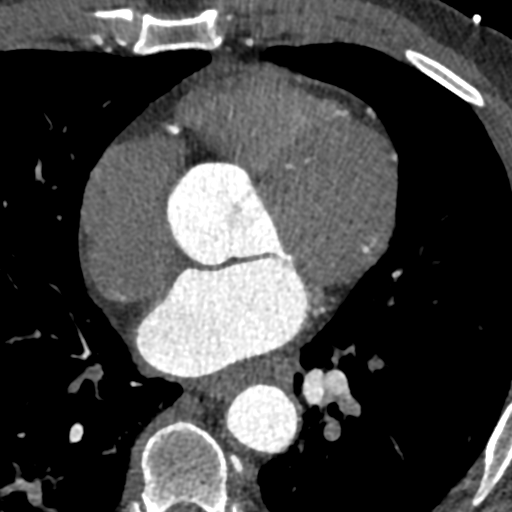

[Series 9: ts syst sharp · axial · 0.39mm/px · z∈[+1418,+1459]mm · 2 of 303 slices shown]
[im 101/303  lung]
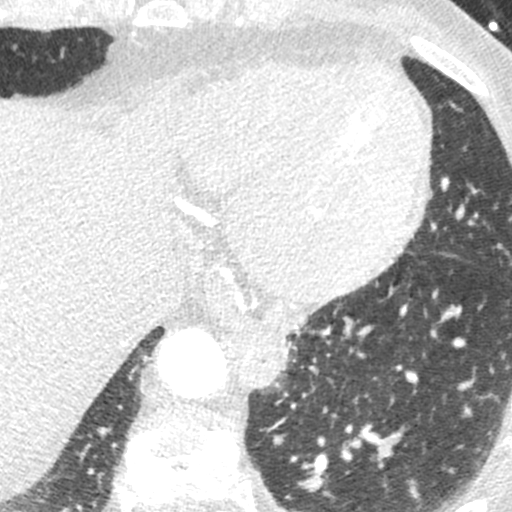
[im 202/303  lung]
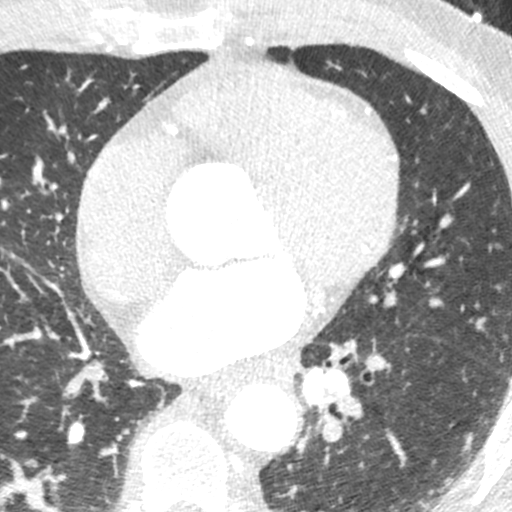

[8 of 20 positions shown; findings below may reference images not displayed]

FINDINGS: Vascular: No significant noncardiac vascular findings.

Mediastinum/Nodes: Visualized mediastinum and hilar regions
demonstrate no lymphadenopathy or masses.

Lungs/Pleura: Visualized lungs show no evidence of pulmonary edema,
consolidation, pneumothorax, nodule or pleural fluid.

Upper Abdomen: No acute abnormality.

Musculoskeletal: No chest wall mass or suspicious bone lesions
identified.
IMPRESSION: No significant incidental findings.
FINDINGS: Image quality: Good

Noise artifact is: Limited

Coronary calcium score is 28.5, which places the patient in the 60th
percentile for age and sex matched control. Calcium in mid LAD and
mid to distal RCA.

Coronary arteries: Normal coronary origins.  Right dominance.

Right Coronary Artery: Minimal mixed atherosclerotic plaque in the
mid and distal RCA, <25% stenosis.

Left Main Coronary Artery: No detectable plaque or stenosis.

Left Anterior Descending Coronary Artery: Minimal atherosclerotic
plaque in the proximal LAD, <25% stenosis. There is a large first
diagonal that bifurcates in it's midportion. There is a moderate
mixed atherosclerotic plaque just beyond this bifurcation in the
Tischlermeister adjacent to the main body of the LAD, 50-69% stenosis. This
vessel is small caliber, approximately 2 mm in diameter.

Ramus intermedius: Minimal atherosclerotic plaque in the proximal
ramus intermedius, <25% stenosis.

Left Circumflex Artery: No detectable plaque or stenosis.

Aorta: Mild dilation of sinus of Valsalva

Sinus of Valsalva:

R-L: 40 mm

L-Non: 41 mm

R-Non: 40 mm

38 mm at the mid ascending aorta (level of the PA bifurcation)
measured double oblique. No calcifications. No dissection.

Aortic Valve: No calcifications.

Other findings:

Normal variant pulmonary vein drainage into the left atrium, common
antrum of left sided pulmonary veins.

Normal left atrial appendage without thrombus.

Normal size of the pulmonary artery.
IMPRESSION: 1. Possible moderate lesion in the mid portion of first diagonal
branch, CADRADS = 3. CT FFR will be performed and reported
separately. Otherwise, minimal CAD.

2. Coronary calcium score is 28.5, which places the patient in the
60th percentile for age and sex matched control.

3. Normal coronary origins with right dominance.

4.  Mild dilation of the sinus of Valsalva, 41 mm.

*** End of Addendum ***
EXAM:
OVER-READ INTERPRETATION  CT CHEST

The following report is an over-read performed by radiologist Dr.
Chantouze Rought [REDACTED] on 01/22/2021. This
over-read does not include interpretation of cardiac or coronary
anatomy or pathology. The coronary CTA interpretation by the
cardiologist is attached.
FINDINGS: Vascular: No significant noncardiac vascular findings.

Mediastinum/Nodes: Visualized mediastinum and hilar regions
demonstrate no lymphadenopathy or masses.

Lungs/Pleura: Visualized lungs show no evidence of pulmonary edema,
consolidation, pneumothorax, nodule or pleural fluid.

Upper Abdomen: No acute abnormality.

Musculoskeletal: No chest wall mass or suspicious bone lesions
identified.
IMPRESSION: No significant incidental findings.

## 2023-02-14 ENCOUNTER — Ambulatory Visit (INDEPENDENT_AMBULATORY_CARE_PROVIDER_SITE_OTHER): Payer: Medicare HMO | Admitting: Family Medicine

## 2023-02-14 VITALS — BP 137/88 | HR 68 | Ht 76.0 in | Wt 241.0 lb

## 2023-02-14 DIAGNOSIS — M545 Low back pain, unspecified: Secondary | ICD-10-CM | POA: Diagnosis not present

## 2023-02-14 DIAGNOSIS — N529 Male erectile dysfunction, unspecified: Secondary | ICD-10-CM

## 2023-02-14 DIAGNOSIS — G8929 Other chronic pain: Secondary | ICD-10-CM | POA: Diagnosis not present

## 2023-02-14 DIAGNOSIS — E1142 Type 2 diabetes mellitus with diabetic polyneuropathy: Secondary | ICD-10-CM | POA: Diagnosis not present

## 2023-02-14 DIAGNOSIS — M25551 Pain in right hip: Secondary | ICD-10-CM

## 2023-02-14 DIAGNOSIS — Z7984 Long term (current) use of oral hypoglycemic drugs: Secondary | ICD-10-CM | POA: Diagnosis not present

## 2023-02-14 LAB — POCT GLYCOSYLATED HEMOGLOBIN (HGB A1C): HbA1c, POC (prediabetic range): 6.1 % (ref 5.7–6.4)

## 2023-02-14 MED ORDER — SILDENAFIL CITRATE 100 MG PO TABS: ORAL_TABLET | ORAL | 2 refills | Status: DC

## 2023-02-14 MED ORDER — LIDOCAINE 5 % EX PTCH
1.0000 | MEDICATED_PATCH | CUTANEOUS | 0 refills | Status: DC
Start: 2023-02-14 — End: 2023-08-11

## 2023-02-14 NOTE — Assessment & Plan Note (Addendum)
A1c 6.1, increased from 5.8 in 06/2022. Diet controlled. Advised patient to follow up with PCP to discuss care gaps. Not currently on statin.
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
## 2023-02-14 NOTE — Patient Instructions (Addendum)
It was wonderful to see you today! Thank you for choosing [facility].   Please bring ALL of your medications with you to every visit.   Today we talked about:  Please have the x-rays done at [address] at your earliest convenience.  Based on those results we can then initiate physical therapy or consider having an MRI for further evaluation.  I would recommend Tylenol 500 mg x 2 tablets up to 3 times per day.  You can take 2 Aleve in the morning and 2 Aleve in the evening as well for pain relief.  I would recommend lidocaine patches that I sent as a prescription to your pharmacy to be applied to your low back.  If you are having worsening pain or gait instability please come back and see [place] sooner.  Please follow up in 3 weeks to discuss back pain and healthcare maintinence  If you haven't already, sign up for My Chart to have easy access to your labs results, and communication with your primary care physician.  Call the clinic at [phone] if your symptoms worsen or you have any concerns.  Please be sure to schedule follow up at the front desk before you leave today.   Elberta Fortis, DO Family Medicine

## 2023-02-14 NOTE — Assessment & Plan Note (Signed)
Refill Sildinafil at patient request. Tolerating medication without concern.

## 2023-02-15 ENCOUNTER — Ambulatory Visit
Admission: RE | Admit: 2023-02-15 | Discharge: 2023-02-15 | Disposition: A | Discharge status: Home / Self Care | Payer: Medicare HMO | Source: Ambulatory Visit | Attending: Family Medicine | Admitting: Family Medicine

## 2023-02-15 ENCOUNTER — Other Ambulatory Visit: Payer: Self-pay

## 2023-02-15 DIAGNOSIS — N529 Male erectile dysfunction, unspecified: Secondary | ICD-10-CM

## 2023-02-15 DIAGNOSIS — M1611 Unilateral primary osteoarthritis, right hip: Secondary | ICD-10-CM | POA: Diagnosis not present

## 2023-02-15 DIAGNOSIS — M545 Low back pain, unspecified: Secondary | ICD-10-CM | POA: Diagnosis not present

## 2023-02-15 DIAGNOSIS — G8929 Other chronic pain: Secondary | ICD-10-CM | POA: Diagnosis not present

## 2023-02-15 DIAGNOSIS — M25551 Pain in right hip: Secondary | ICD-10-CM | POA: Diagnosis not present

## 2023-03-09 ENCOUNTER — Ambulatory Visit: Payer: Medicare HMO | Attending: Family Medicine | Admitting: Physical Therapy
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
## 2023-03-10 ENCOUNTER — Telehealth: Payer: Self-pay | Admitting: Family Medicine

## 2023-03-10 NOTE — Telephone Encounter (Signed)
Attempted to call patient x 2, unable to leave voicemail as mailbox is full.  Imaging showed mild bilateral osteoarthritis of hips, similar with prior imaging in 2020.  Lumbar x-Kavari showed moderate degenerative disc disease without acute process.  Patient referred to PT at appointment on 02/14/2023, would recommend scheduling sessions for further evaluation.  Continue therapy with Tylenol [phone] mg 3 times per day as needed.  Can trial methocarbamol 500 mg for symptom relief.  Appropriate to refer to PM&R for discussion of epidural injections if patient desires.  Patient needs to follow-up with PCP to discuss multiple other care gaps.  Elberta Fortis, DO

## 2023-03-31 ENCOUNTER — Ambulatory Visit: Payer: Medicare HMO | Attending: Family Medicine
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
## 2023-04-11 ENCOUNTER — Ambulatory Visit: Payer: Medicare HMO | Attending: Family Medicine | Admitting: Physical Therapy

## 2023-04-18 ENCOUNTER — Ambulatory Visit
Admission: RE | Admit: 2023-04-18 | Discharge: 2023-04-18 | Disposition: A | Discharge status: Home / Self Care | Payer: Medicare HMO | Source: Ambulatory Visit | Attending: Family Medicine

## 2023-04-18 ENCOUNTER — Ambulatory Visit: Payer: Medicare HMO

## 2023-04-18 VITALS — BP 129/85 | HR 91 | Ht 76.0 in | Wt 222.8 lb

## 2023-04-18 DIAGNOSIS — Z9181 History of falling: Secondary | ICD-10-CM | POA: Diagnosis not present

## 2023-04-18 DIAGNOSIS — M25551 Pain in right hip: Secondary | ICD-10-CM | POA: Diagnosis not present

## 2023-04-18 DIAGNOSIS — S0990XA Unspecified injury of head, initial encounter: Secondary | ICD-10-CM

## 2023-04-18 DIAGNOSIS — R296 Repeated falls: Secondary | ICD-10-CM | POA: Diagnosis not present

## 2023-04-18 DIAGNOSIS — M25562 Pain in left knee: Secondary | ICD-10-CM | POA: Diagnosis not present

## 2023-04-18 DIAGNOSIS — M25561 Pain in right knee: Secondary | ICD-10-CM

## 2023-04-18 DIAGNOSIS — M545 Low back pain, unspecified: Secondary | ICD-10-CM

## 2023-04-18 NOTE — Patient Instructions (Addendum)
It was great to see you today! Thank you for choosing [facility] for your primary care.  Today we addressed: We will get an xray of the right hip and knees  Go to [address]. I have placed a referral to neurosurgery and physical therapy as well as home health and social work. Please await their call.  3. Return in 2 weeks with pcp   If you haven't already, sign up for My Chart to have easy access to your labs results, and communication with your primary care physician. I recommend that you always bring your medications to each appointment as this makes it easy to ensure you are on the correct medications and helps [place] not miss refills when you need them. Call the clinic at [phone] if your symptoms worsen or you have any concerns.  Please arrive 15 minutes before your appointment to ensure smooth check in process.  We appreciate your efforts in making this happen.  Thank you for allowing me to participate in your care, Alfredo Martinez, MD 04/18/2023, 3:58 PM PGY-3, [facility]
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
## 2023-04-19 ENCOUNTER — Telehealth: Payer: Self-pay | Admitting: *Deleted

## 2023-04-19 NOTE — Progress Notes (Unsigned)
  Care Coordination  Outreach Note  04/19/2023 Name: JODEN STOUDT MRN: [id number] DOB: Jan 09, 1956   Care Coordination Outreach Attempts: An unsuccessful telephone outreach was attempted today to offer the patient information about available care coordination services.  Follow Up Plan:  Additional outreach attempts will be made to offer the patient care coordination information and services.   Encounter Outcome:  No Answer  Gwenevere Ghazi  Care Coordination Care Guide  Direct Dial: [phone]

## 2023-04-21 NOTE — Progress Notes (Signed)
  Care Coordination   Note   04/21/2023 Name: [name] MRN: [id number] DOB: Nov 16, 1955  Mark Cook is a 67 y.o. year old male who sees Shitarev, Dimitry, MD for primary care. I reached out to [employer] by phone today to offer care coordination services.  Mr. Plack was given information about Care Coordination services today including:   The Care Coordination services include support from the care team which includes your Nurse Coordinator, Clinical Social Worker, or Pharmacist.  The Care Coordination team is here to help remove barriers to the health concerns and goals most important to you. Care Coordination services are voluntary, and the patient may decline or stop services at any time by request to their care team member.   Care Coordination Consent Status: Patient agreed to services and verbal consent obtained.   Follow up plan:  Telephone appointment with care coordination team member scheduled for:  05/02/23  Encounter Outcome:  Patient Scheduled  [facility] Coordination Care Guide  Direct Dial: [phone]

## 2023-04-25 ENCOUNTER — Ambulatory Visit: Payer: Self-pay | Admitting: Student

## 2023-04-25 NOTE — Progress Notes (Unsigned)
    SUBJECTIVE:   CHIEF COMPLAINT / HPI:   Recent Fall  Instability -On last visit, patient was seen for a fall that occurred on  -XR hip and knee negative for fracture  -   PERTINENT  PMH / PSH: ***  OBJECTIVE:   There were no vitals taken for this visit.  ***  ASSESSMENT/PLAN:   No problem-specific Assessment & Plan notes found for this encounter.     Alfredo Martinez, MD [facility]

## 2023-04-25 NOTE — Patient Instructions (Incomplete)
It was great to see you today! Thank you for choosing [facility] for your primary care.  Today we addressed: ***  If you haven't already, sign up for My Chart to have easy access to your labs results, and communication with your primary care physician. {AVS options:28020} No follow-ups on file. Please arrive 15 minutes before your appointment to ensure smooth check in process.  We appreciate your efforts in making this happen.  Thank you for allowing me to participate in your care, Alfredo Martinez, MD 04/25/2023, 10:09 AM PGY-***, [facility]

## 2023-04-26 ENCOUNTER — Telehealth: Payer: Self-pay

## 2023-04-26 NOTE — Telephone Encounter (Signed)
Attempted to call patient to schedule a follow up appointment per Dr. Jena Gauss request.  Couldn't leave VM due to patient's VM being full.  Drusilla Kanner, CMA

## 2023-04-27 DIAGNOSIS — M25551 Pain in right hip: Secondary | ICD-10-CM | POA: Diagnosis not present

## 2023-04-27 DIAGNOSIS — M25562 Pain in left knee: Secondary | ICD-10-CM | POA: Diagnosis not present

## 2023-04-27 DIAGNOSIS — S0990XD Unspecified injury of head, subsequent encounter: Secondary | ICD-10-CM | POA: Diagnosis not present

## 2023-04-27 DIAGNOSIS — G8191 Hemiplegia, unspecified affecting right dominant side: Secondary | ICD-10-CM | POA: Diagnosis not present

## 2023-04-27 DIAGNOSIS — M21371 Foot drop, right foot: Secondary | ICD-10-CM | POA: Diagnosis not present

## 2023-04-27 DIAGNOSIS — M5136 Other intervertebral disc degeneration, lumbar region with discogenic back pain only: Secondary | ICD-10-CM | POA: Diagnosis not present

## 2023-04-27 DIAGNOSIS — M25561 Pain in right knee: Secondary | ICD-10-CM | POA: Diagnosis not present

## 2023-04-27 DIAGNOSIS — G8929 Other chronic pain: Secondary | ICD-10-CM | POA: Diagnosis not present

## 2023-04-27 DIAGNOSIS — E1142 Type 2 diabetes mellitus with diabetic polyneuropathy: Secondary | ICD-10-CM | POA: Diagnosis not present

## 2023-04-27 NOTE — Telephone Encounter (Signed)
Can't call this afternoon, due to not working clinic this afternoon   So Attempted to call patient again this morning to schedule a follow up appointment per Dr. Jena Gauss request.  Was forward to VM - couldn't leave a VM due to VM being full.   Drusilla Kanner, CMA

## 2023-05-02 ENCOUNTER — Telehealth: Payer: Self-pay | Admitting: *Deleted

## 2023-05-02 ENCOUNTER — Other Ambulatory Visit: Payer: Self-pay | Admitting: *Deleted

## 2023-05-02 NOTE — Patient Outreach (Signed)
  Care Management   Follow Up Note   05/02/2023 Name: Mark Cook MRN: [id number] DOB: 11/14/1955   Referred by: Nelia Shi, MD Reason for referral : Care Management (RNCM: ATTEMPT #1 for Initial Outreach for Chronic Disease Management & Care Coordination Needs)   An unsuccessful telephone outreach was attempted today. The patient was referred to the case management team for assistance with care management and care coordination.   Follow Up Plan: The care management team will reach out to the patient again over the next 30 days.   Danise Edge, BSN RN RN Care Manager  [facility]  Ambulatory Care Management  Direct Number: [phone]

## 2023-05-06 ENCOUNTER — Encounter: Payer: Self-pay | Admitting: Student

## 2023-05-06 ENCOUNTER — Telehealth: Payer: Self-pay | Admitting: *Deleted

## 2023-05-06 NOTE — Progress Notes (Unsigned)
Complex Care Management Care Guide Note  05/06/2023 Name: Mark Cook MRN: [id number] DOB: [birth year]  Mark Cook is a 67 y.o. year old male who is a primary care patient of Shitarev, Dimitry, MD and is actively engaged with the care management team. I reached out to Mark Cook by phone today to assist with re-scheduling  with the RN Case Manager.  Follow up plan: Unsuccessful telephone outreach attempt made.   [facility]  Care Coordination Care Guide  Direct Dial: [phone]

## 2023-05-09 DIAGNOSIS — M5136 Other intervertebral disc degeneration, lumbar region with discogenic back pain only: Secondary | ICD-10-CM | POA: Diagnosis not present

## 2023-05-09 DIAGNOSIS — M25561 Pain in right knee: Secondary | ICD-10-CM | POA: Diagnosis not present

## 2023-05-09 DIAGNOSIS — E1142 Type 2 diabetes mellitus with diabetic polyneuropathy: Secondary | ICD-10-CM | POA: Diagnosis not present

## 2023-05-09 DIAGNOSIS — G8929 Other chronic pain: Secondary | ICD-10-CM | POA: Diagnosis not present

## 2023-05-09 DIAGNOSIS — M21371 Foot drop, right foot: Secondary | ICD-10-CM | POA: Diagnosis not present

## 2023-05-09 DIAGNOSIS — M25562 Pain in left knee: Secondary | ICD-10-CM | POA: Diagnosis not present

## 2023-05-09 DIAGNOSIS — S0990XD Unspecified injury of head, subsequent encounter: Secondary | ICD-10-CM | POA: Diagnosis not present

## 2023-05-09 DIAGNOSIS — G8191 Hemiplegia, unspecified affecting right dominant side: Secondary | ICD-10-CM | POA: Diagnosis not present

## 2023-05-09 DIAGNOSIS — M25551 Pain in right hip: Secondary | ICD-10-CM | POA: Diagnosis not present

## 2023-05-16 NOTE — Progress Notes (Signed)
 Complex Care Management Care Guide Note  05/16/2023 Name: NATASHA PAULSON MRN: [id number] DOB: 10-Dec-1955  Levander JAYSON Netters is a 68 y.o. year old male who is a primary care patient of Shitarev, Dimitry, MD and is actively engaged with the care management team. I reached out to Levander JAYSON Netters by phone today to assist with re-scheduling  with the RN Case Manager.  Follow up plan: Unsuccessful telephone outreach attempt made. A HIPAA compliant phone message was left for the patient providing contact information and requesting a return call. No additional calls will be made.   [facility]  Care Coordination Care Guide  Direct Dial: [phone]

## 2023-05-19 DIAGNOSIS — M5136 Other intervertebral disc degeneration, lumbar region with discogenic back pain only: Secondary | ICD-10-CM | POA: Diagnosis not present

## 2023-05-19 DIAGNOSIS — G8929 Other chronic pain: Secondary | ICD-10-CM | POA: Diagnosis not present

## 2023-05-19 DIAGNOSIS — M21371 Foot drop, right foot: Secondary | ICD-10-CM | POA: Diagnosis not present

## 2023-05-19 DIAGNOSIS — M25561 Pain in right knee: Secondary | ICD-10-CM | POA: Diagnosis not present

## 2023-05-19 DIAGNOSIS — S0990XD Unspecified injury of head, subsequent encounter: Secondary | ICD-10-CM | POA: Diagnosis not present

## 2023-05-19 DIAGNOSIS — G8191 Hemiplegia, unspecified affecting right dominant side: Secondary | ICD-10-CM | POA: Diagnosis not present

## 2023-05-19 DIAGNOSIS — M25551 Pain in right hip: Secondary | ICD-10-CM | POA: Diagnosis not present

## 2023-05-19 DIAGNOSIS — M25562 Pain in left knee: Secondary | ICD-10-CM | POA: Diagnosis not present

## 2023-05-19 DIAGNOSIS — E1142 Type 2 diabetes mellitus with diabetic polyneuropathy: Secondary | ICD-10-CM | POA: Diagnosis not present

## 2023-05-20 DIAGNOSIS — M5136 Other intervertebral disc degeneration, lumbar region with discogenic back pain only: Secondary | ICD-10-CM | POA: Diagnosis not present

## 2023-05-20 DIAGNOSIS — M21371 Foot drop, right foot: Secondary | ICD-10-CM | POA: Diagnosis not present

## 2023-05-20 DIAGNOSIS — M25551 Pain in right hip: Secondary | ICD-10-CM | POA: Diagnosis not present

## 2023-05-20 DIAGNOSIS — G8191 Hemiplegia, unspecified affecting right dominant side: Secondary | ICD-10-CM | POA: Diagnosis not present

## 2023-05-20 DIAGNOSIS — E1142 Type 2 diabetes mellitus with diabetic polyneuropathy: Secondary | ICD-10-CM | POA: Diagnosis not present

## 2023-05-20 DIAGNOSIS — G8929 Other chronic pain: Secondary | ICD-10-CM | POA: Diagnosis not present

## 2023-05-20 DIAGNOSIS — S0990XD Unspecified injury of head, subsequent encounter: Secondary | ICD-10-CM | POA: Diagnosis not present

## 2023-05-20 DIAGNOSIS — M25562 Pain in left knee: Secondary | ICD-10-CM | POA: Diagnosis not present

## 2023-05-20 DIAGNOSIS — M25561 Pain in right knee: Secondary | ICD-10-CM | POA: Diagnosis not present

## 2023-05-23 DIAGNOSIS — E1142 Type 2 diabetes mellitus with diabetic polyneuropathy: Secondary | ICD-10-CM | POA: Diagnosis not present

## 2023-05-23 DIAGNOSIS — M5136 Other intervertebral disc degeneration, lumbar region with discogenic back pain only: Secondary | ICD-10-CM | POA: Diagnosis not present

## 2023-05-23 DIAGNOSIS — M25551 Pain in right hip: Secondary | ICD-10-CM | POA: Diagnosis not present

## 2023-05-23 DIAGNOSIS — M25561 Pain in right knee: Secondary | ICD-10-CM | POA: Diagnosis not present

## 2023-05-23 DIAGNOSIS — M25562 Pain in left knee: Secondary | ICD-10-CM | POA: Diagnosis not present

## 2023-05-23 DIAGNOSIS — S0990XD Unspecified injury of head, subsequent encounter: Secondary | ICD-10-CM | POA: Diagnosis not present

## 2023-05-23 DIAGNOSIS — M21371 Foot drop, right foot: Secondary | ICD-10-CM | POA: Diagnosis not present

## 2023-05-23 DIAGNOSIS — G8191 Hemiplegia, unspecified affecting right dominant side: Secondary | ICD-10-CM | POA: Diagnosis not present

## 2023-05-23 DIAGNOSIS — G8929 Other chronic pain: Secondary | ICD-10-CM | POA: Diagnosis not present

## 2023-05-31 DIAGNOSIS — S0990XD Unspecified injury of head, subsequent encounter: Secondary | ICD-10-CM | POA: Diagnosis not present

## 2023-05-31 DIAGNOSIS — M25562 Pain in left knee: Secondary | ICD-10-CM | POA: Diagnosis not present

## 2023-05-31 DIAGNOSIS — M25561 Pain in right knee: Secondary | ICD-10-CM | POA: Diagnosis not present

## 2023-05-31 DIAGNOSIS — G8929 Other chronic pain: Secondary | ICD-10-CM | POA: Diagnosis not present

## 2023-05-31 DIAGNOSIS — M5136 Other intervertebral disc degeneration, lumbar region with discogenic back pain only: Secondary | ICD-10-CM | POA: Diagnosis not present

## 2023-05-31 DIAGNOSIS — M21371 Foot drop, right foot: Secondary | ICD-10-CM | POA: Diagnosis not present

## 2023-05-31 DIAGNOSIS — G8191 Hemiplegia, unspecified affecting right dominant side: Secondary | ICD-10-CM | POA: Diagnosis not present

## 2023-05-31 DIAGNOSIS — E1142 Type 2 diabetes mellitus with diabetic polyneuropathy: Secondary | ICD-10-CM | POA: Diagnosis not present

## 2023-05-31 DIAGNOSIS — M25551 Pain in right hip: Secondary | ICD-10-CM | POA: Diagnosis not present

## 2023-06-02 ENCOUNTER — Other Ambulatory Visit: Payer: Self-pay | Admitting: Family Medicine

## 2023-06-02 DIAGNOSIS — N529 Male erectile dysfunction, unspecified: Secondary | ICD-10-CM

## 2023-06-06 DIAGNOSIS — G8191 Hemiplegia, unspecified affecting right dominant side: Secondary | ICD-10-CM | POA: Diagnosis not present

## 2023-06-06 DIAGNOSIS — M25562 Pain in left knee: Secondary | ICD-10-CM | POA: Diagnosis not present

## 2023-06-06 DIAGNOSIS — M25561 Pain in right knee: Secondary | ICD-10-CM | POA: Diagnosis not present

## 2023-06-06 DIAGNOSIS — G8929 Other chronic pain: Secondary | ICD-10-CM | POA: Diagnosis not present

## 2023-06-06 DIAGNOSIS — S0990XD Unspecified injury of head, subsequent encounter: Secondary | ICD-10-CM | POA: Diagnosis not present

## 2023-06-06 DIAGNOSIS — E1142 Type 2 diabetes mellitus with diabetic polyneuropathy: Secondary | ICD-10-CM | POA: Diagnosis not present

## 2023-06-06 DIAGNOSIS — M25551 Pain in right hip: Secondary | ICD-10-CM | POA: Diagnosis not present

## 2023-06-06 DIAGNOSIS — M5136 Other intervertebral disc degeneration, lumbar region with discogenic back pain only: Secondary | ICD-10-CM | POA: Diagnosis not present

## 2023-06-06 DIAGNOSIS — M21371 Foot drop, right foot: Secondary | ICD-10-CM | POA: Diagnosis not present

## 2023-06-08 ENCOUNTER — Telehealth: Payer: Self-pay

## 2023-06-08 NOTE — Telephone Encounter (Signed)
Mark Cook with [facility] calls nurse line requesting DME orders.   She reports the patient could benefit from:  Rolator  Bariatric Shower Chair   She reports this is a recommendation from his occupational therapist.   Patient will need an office visit note to state the need for DME orders for insurance to pay.

## 2023-06-11 ENCOUNTER — Other Ambulatory Visit: Payer: Self-pay | Admitting: Student

## 2023-06-11 DIAGNOSIS — G8929 Other chronic pain: Secondary | ICD-10-CM

## 2023-06-11 DIAGNOSIS — Z9181 History of falling: Secondary | ICD-10-CM

## 2023-06-11 NOTE — Progress Notes (Signed)
ADDITION to note from 04/18/23:  As reported in previous office visit note: It is imperative for patient's safety that he have assistance at home and discussed possibility of life alert.   He also requires DME device assistance at home via rollator and bariatric shower chair.   He is high fall risk and these were also recommended by occupational therapy.   Alfredo Martinez MD

## 2023-06-14 NOTE — Telephone Encounter (Signed)
Confirmation received by [employer].

## 2023-06-14 NOTE — Telephone Encounter (Signed)
Community message sent to adapt

## 2023-06-17 ENCOUNTER — Encounter: Payer: Self-pay | Admitting: Family Medicine

## 2023-08-11 ENCOUNTER — Encounter: Payer: Self-pay | Admitting: Family Medicine

## 2023-08-11 ENCOUNTER — Ambulatory Visit: Admitting: Family Medicine

## 2023-08-11 VITALS — BP 119/77 | HR 73 | Ht 76.0 in | Wt 221.2 lb

## 2023-08-11 DIAGNOSIS — Z23 Encounter for immunization: Secondary | ICD-10-CM | POA: Diagnosis not present

## 2023-08-11 DIAGNOSIS — G8929 Other chronic pain: Secondary | ICD-10-CM

## 2023-08-11 DIAGNOSIS — M150 Primary generalized (osteo)arthritis: Secondary | ICD-10-CM | POA: Diagnosis not present

## 2023-08-11 DIAGNOSIS — M545 Low back pain, unspecified: Secondary | ICD-10-CM | POA: Diagnosis not present

## 2023-08-11 DIAGNOSIS — E1142 Type 2 diabetes mellitus with diabetic polyneuropathy: Secondary | ICD-10-CM

## 2023-08-11 DIAGNOSIS — R296 Repeated falls: Secondary | ICD-10-CM

## 2023-08-11 DIAGNOSIS — M1990 Unspecified osteoarthritis, unspecified site: Secondary | ICD-10-CM | POA: Insufficient documentation

## 2023-08-11 DIAGNOSIS — Z0000 Encounter for general adult medical examination without abnormal findings: Secondary | ICD-10-CM | POA: Diagnosis not present

## 2023-08-11 DIAGNOSIS — Z1211 Encounter for screening for malignant neoplasm of colon: Secondary | ICD-10-CM

## 2023-08-11 DIAGNOSIS — G8191 Hemiplegia, unspecified affecting right dominant side: Secondary | ICD-10-CM

## 2023-08-11 LAB — POCT GLYCOSYLATED HEMOGLOBIN (HGB A1C): HbA1c, POC (controlled diabetic range): 6.1 % (ref 0.0–7.0)

## 2023-08-11 MED ORDER — LIDOCAINE 5 % EX PTCH: 1.0000 | MEDICATED_PATCH | CUTANEOUS | 0 refills | Status: DC
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
## 2023-08-11 NOTE — Assessment & Plan Note (Signed)
 Right shoulder, elbow, left knee pain.  Chronic, poorly controlled. -Instructed patient to discontinue ibuprofen given age and risk factors for peptic ulcer -Limit acetaminophen 3000 mg daily, only if needed -Lidocaine patch 5% topical 12 hours daily -Voltaren gel as needed -Follow-up 3 months

## 2023-08-11 NOTE — Assessment & Plan Note (Signed)
 A1c at goal given age.  ASCVD 10-year risk of 19%.  Requires high intensity statin, however declines given prior myalgias.  Will revisit next visit.  Believe patient may tolerate rosuvastatin.  Does have history of left toe amputation, should follow-up closely with podiatry. -Microalbumin/creatinine ratio -BMP -Discuss starting statin and metformin next visit -Ambulatory referral to podiatry for cutting nails, right toe ulcer -Follow-up 3 months
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
## 2023-08-11 NOTE — Patient Instructions (Addendum)
 It was great to see you today! Thank you for choosing [facility] for your primary care.  Today we addressed: Type 2 diabetes mellitus with diabetic polyneuropathy, without long-term current use of insulin (HCC) We are checking your kidney function today. I am referring you to an eye physician.  You can also see an optometrist in the meantime to get your vision checked. I am referring you to Podiatry for your feet.  Please make sure to have good fitting shoes and to dry your feet well after shower.  Right sided weakness & Frequent falls I am referring you to Occupational therapy and Physical therapy. I am fine with chiropractor, just let them know about your spine injury history and be very careful with neck.  Also fine to avoid.  Osteoarthritis Please stop taking Advil/ibuprofen because of a risk of stomach ulcer. You can take up to 3000 mg of acetaminophen (Tylenol) per day, if the pain is bad. Please try OTC Voltaren gel over the areas multiple times per day. You can also do OTC lidocaine patches, you can wear these up to 12 hours per day.  We are checking some labs today, including metabolic panel and kidney function.  You will get a MyChart message or a letter if results are normal. Otherwise, you will get a call from [place].  Colonoscopy Schedule your colonoscopy to help detect colon cancer. The stomach doctors will call to schedule an appt with you. If you decide against this, please ask about the cologuard as an option.     If you had a referral placed, they will call you to set up an appointment. Please give [place] a call if you don't hear back in the next 2 weeks.  You should return to our clinic in 3 months.  Thank you for coming to see [place] at [facility] and for the opportunity to care for you! Sharion Dove, Latissa Frick, MD 08/11/2023, 11:53 AM

## 2023-08-12 ENCOUNTER — Telehealth: Payer: Self-pay

## 2023-08-12 ENCOUNTER — Encounter: Payer: Self-pay | Admitting: Family Medicine

## 2023-08-12 LAB — MICROALBUMIN / CREATININE URINE RATIO
Creatinine, Urine: 102.9 mg/dL
Microalb/Creat Ratio: <3 mg/g{creat} (ref 0–29)
Microalbumin, Urine: <3 ug/mL

## 2023-08-12 LAB — BASIC METABOLIC PANEL WITH GFR
BUN/Creatinine Ratio: 16 (ref 10–24)
BUN: 13 mg/dL (ref 8–27)
CO2: 22 mmol/L (ref 20–29)
Calcium: 9.3 mg/dL (ref 8.6–10.2)
Chloride: 103 mmol/L (ref 96–106)
Creatinine, Ser: 0.83 mg/dL (ref 0.76–1.27)
Glucose: 99 mg/dL (ref 70–99)
Potassium: 4.6 mmol/L (ref 3.5–5.2)
Sodium: 142 mmol/L (ref 134–144)
eGFR: 96 mL/min/{1.73_m2} (ref >59)

## 2023-08-12 NOTE — Telephone Encounter (Signed)
 Forms found in RN box.   Copy made for batch scanning.   Medication list printed.   Everything given to patient.

## 2023-08-15 IMAGING — US US EXTREM LOW*L* COMPLETE
2 series · 14 of 25 positions shown · non-contrast
Comparison: Left knee x-rays dated July 13, 2021.

CLINICAL DATA: Severe left knee pain since fall 3 weeks ago.
Concern for quadriceps tendon tear.

EXAM:
LEFT LOWER EXTREMITY SOFT TISSUE ULTRASOUND COMPLETE
TECHNIQUE: Ultrasound examination was performed including evaluation of the
muscles, tendons, joint, and adjacent soft tissues.

[Series 1: us extrem low*left* complete · 0.07mm/px · 36 acquisitions, 13 frames shown (1 of 2)]
[im 1/36]
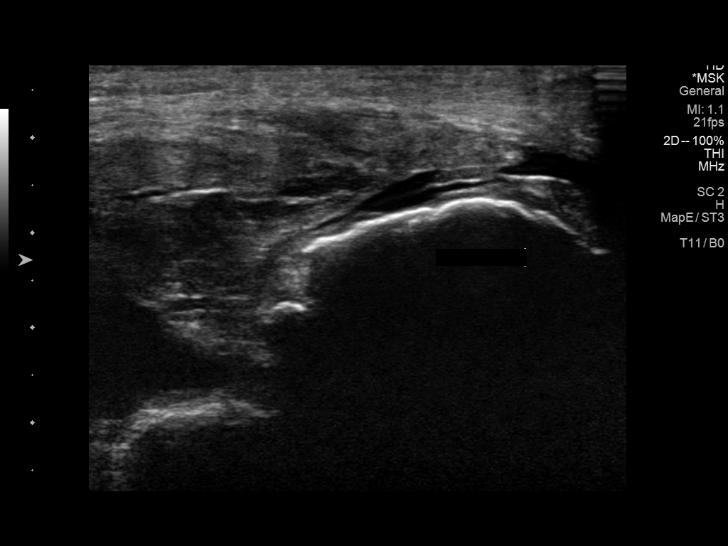
[im 4/36]
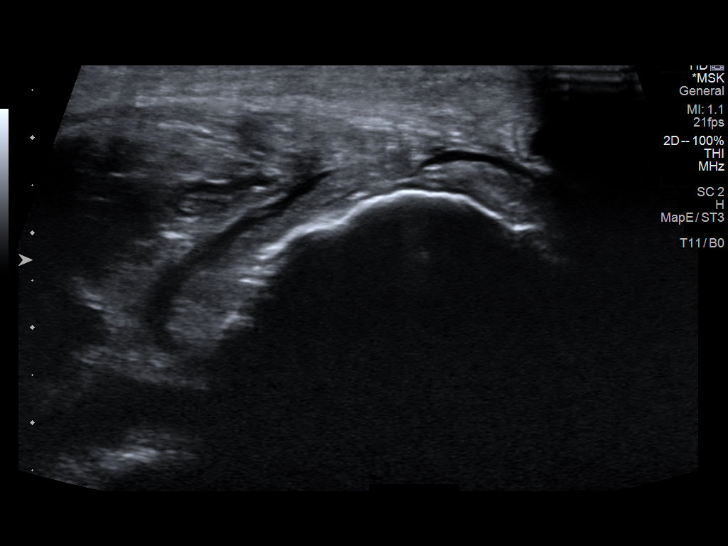
[im 7/36]
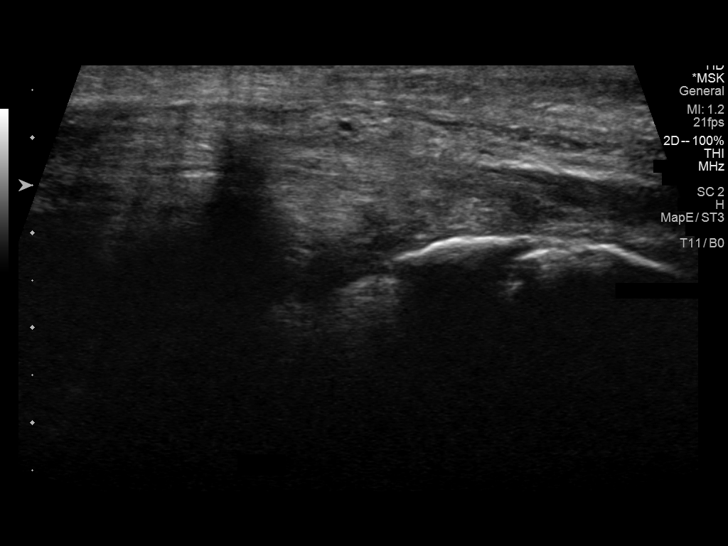
[im 10/36]
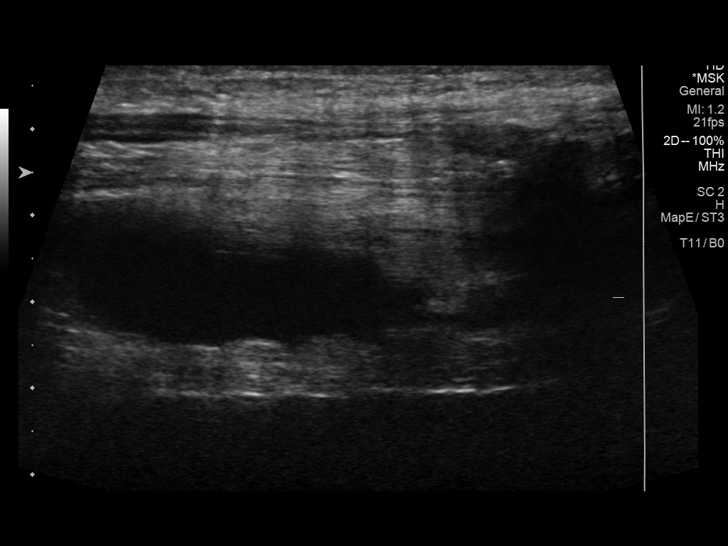
[im 13/36]
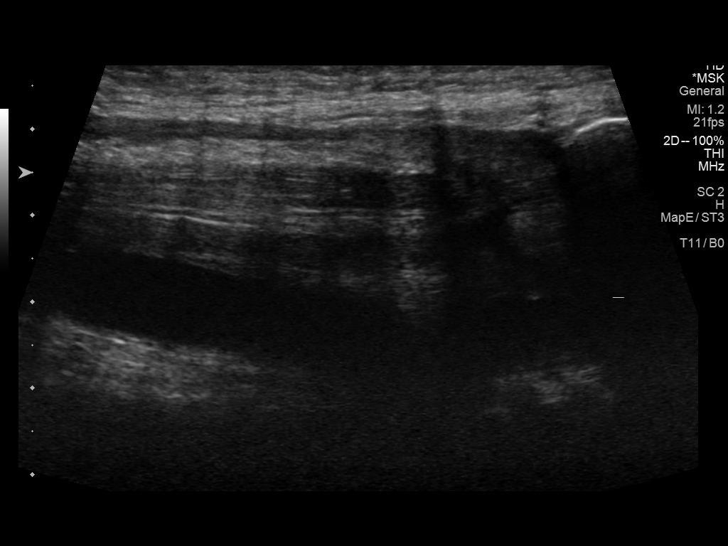
[im 14/36]
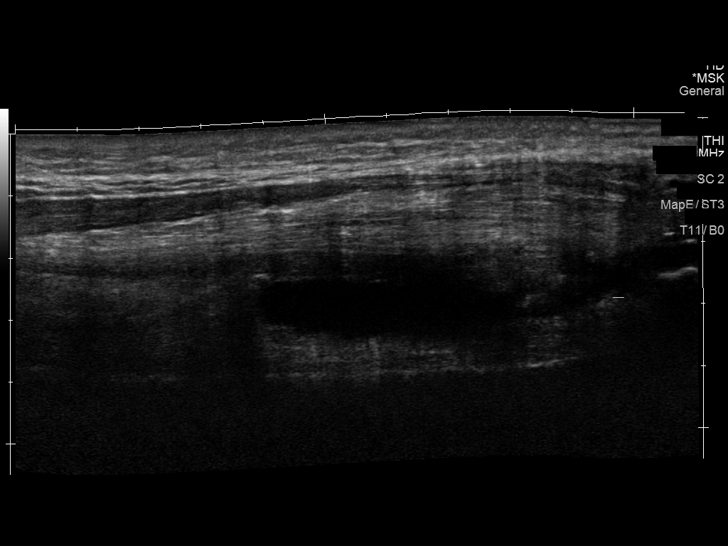
[im 17/36]
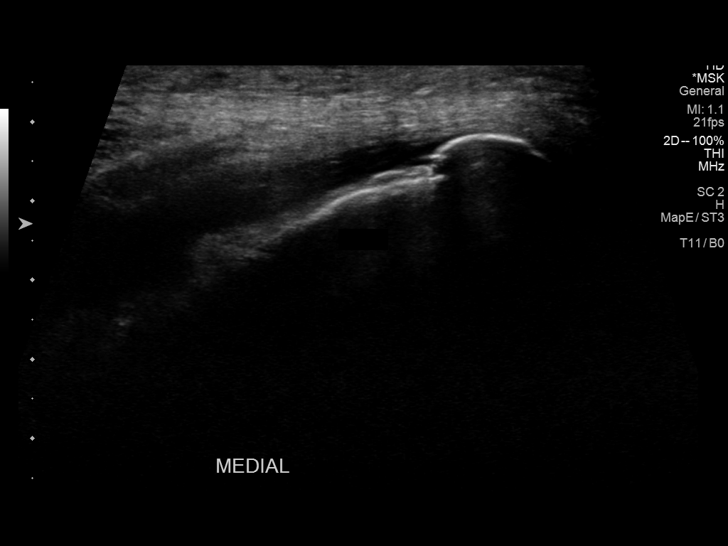
[im 20/36]
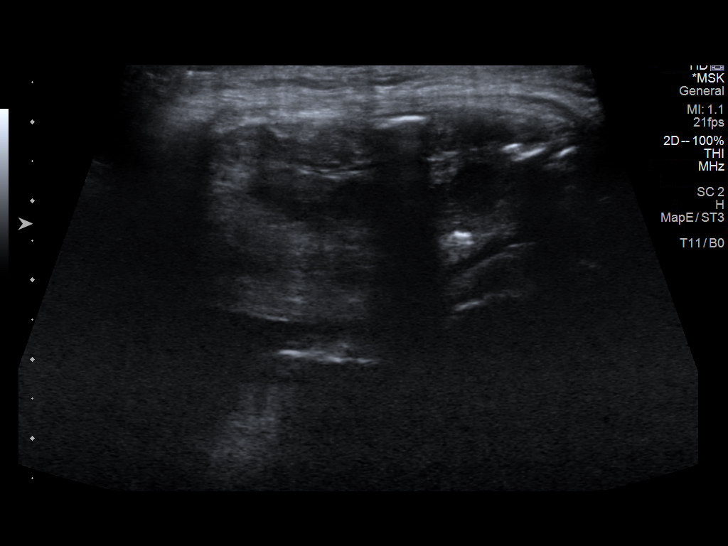
[im 23/36]
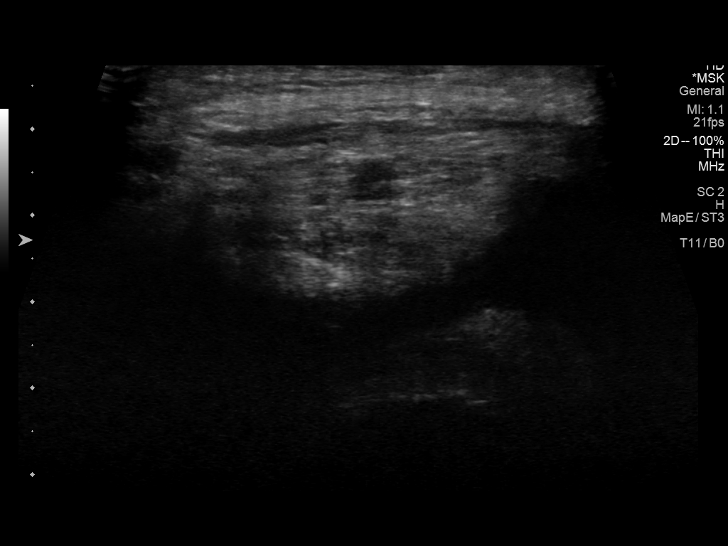
[im 25/36]
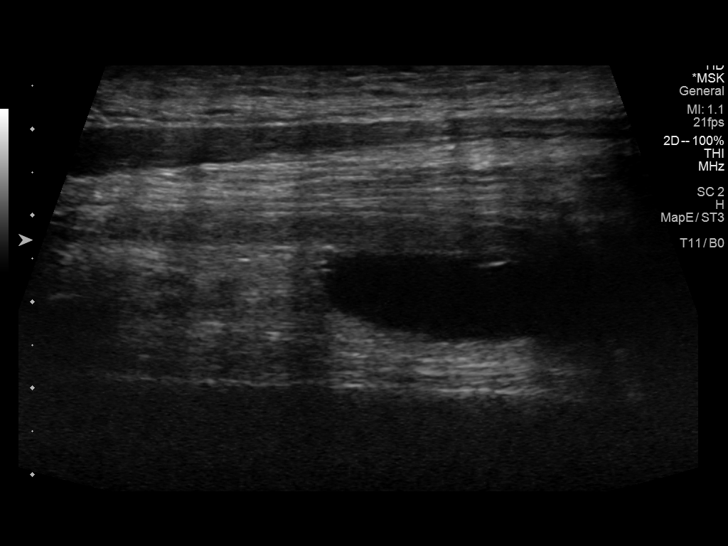
[im 28/36]
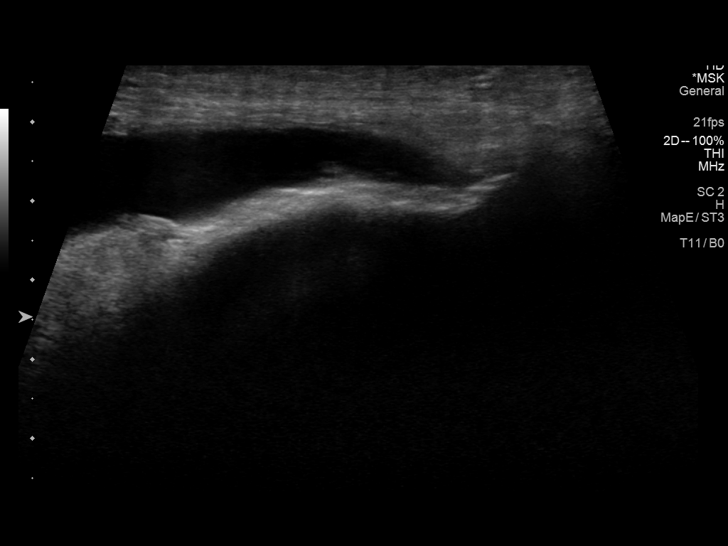
[im 31/36]
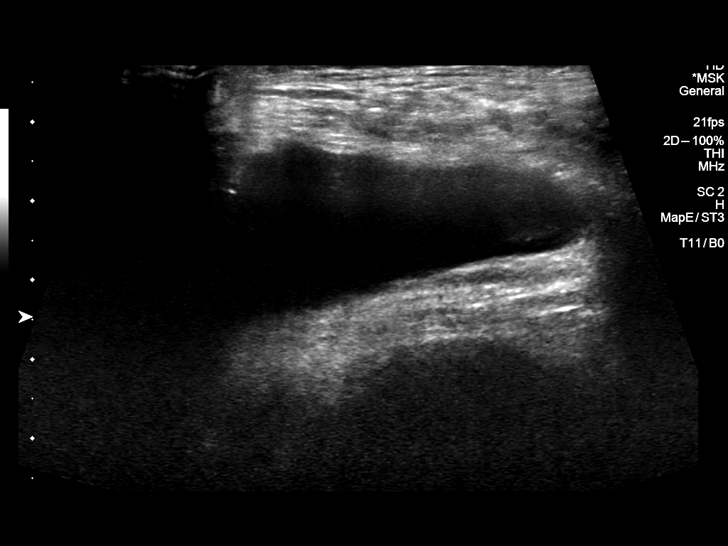
[im 34/36]
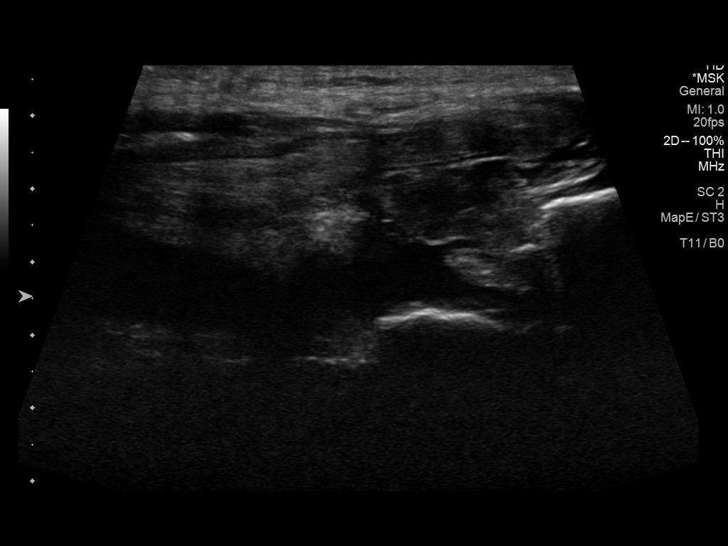

[Series 2: us extrem low*left* complete · 2 acquisitions, 1 frame shown (2 of 2)]
[im 1/2]
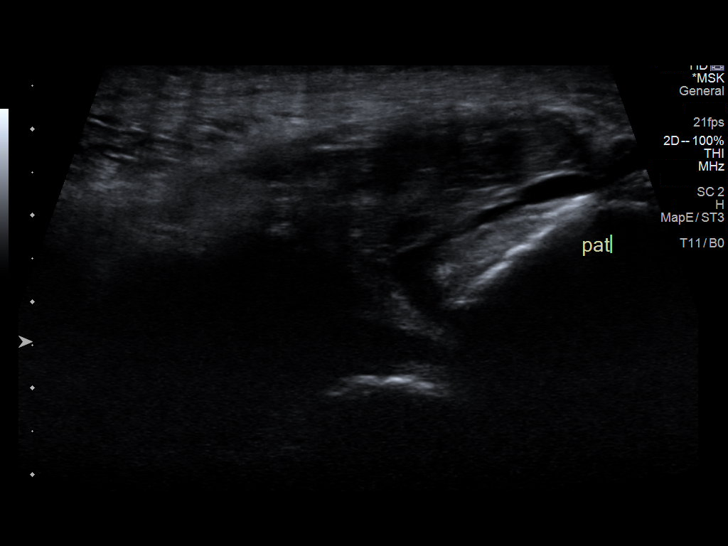

[14 of 25 positions shown; findings below may reference images not displayed]

FINDINGS: Focused ultrasound of the left knee demonstrates a complete tear of
the quadriceps tendon from the patella, with approximately 4 cm
retraction. The tendon gap is filled with fluid and hemorrhage.
Small amount of fluid in the prepatellar bursa. Moderate to large
suprapatellar joint effusion.
IMPRESSION: 1. Complete tear of the quadriceps tendon from the patella with 4 cm
retraction.
2. Moderate to large suprapatellar joint effusion.

## 2023-08-17 ENCOUNTER — Telehealth: Payer: Self-pay | Admitting: Family Medicine

## 2023-08-17 ENCOUNTER — Encounter: Payer: Self-pay | Admitting: Podiatrist

## 2023-08-17 ENCOUNTER — Ambulatory Visit (INDEPENDENT_AMBULATORY_CARE_PROVIDER_SITE_OTHER): Admitting: Podiatrist

## 2023-08-17 VITALS — Ht 76.0 in | Wt 221.0 lb

## 2023-08-17 DIAGNOSIS — M21371 Foot drop, right foot: Secondary | ICD-10-CM

## 2023-08-17 DIAGNOSIS — E1142 Type 2 diabetes mellitus with diabetic polyneuropathy: Secondary | ICD-10-CM | POA: Diagnosis not present

## 2023-08-17 DIAGNOSIS — R0989 Other specified symptoms and signs involving the circulatory and respiratory systems: Secondary | ICD-10-CM

## 2023-08-17 DIAGNOSIS — R234 Changes in skin texture: Secondary | ICD-10-CM

## 2023-08-17 NOTE — Patient Instructions (Signed)
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
## 2023-08-17 NOTE — Telephone Encounter (Signed)
 Patient brought in form that was completed during last visit. States there is something marked incorrectly and asks if pcp could change it. Placed in pcp's box

## 2023-08-17 NOTE — Telephone Encounter (Signed)
 Form fixed and handed to me by Dr Sharion Dove. Called patient to let him know form is ready to be picked up. No answer, VM full. Will place form up front and try patient again later.

## 2023-08-18 ENCOUNTER — Other Ambulatory Visit: Payer: Self-pay | Admitting: *Deleted

## 2023-08-18 DIAGNOSIS — R0989 Other specified symptoms and signs involving the circulatory and respiratory systems: Secondary | ICD-10-CM

## 2023-08-22 ENCOUNTER — Ambulatory Visit: Attending: Family Medicine | Admitting: Occupational Therapy

## 2023-08-22 ENCOUNTER — Other Ambulatory Visit: Payer: Self-pay

## 2023-08-22 DIAGNOSIS — R293 Abnormal posture: Secondary | ICD-10-CM | POA: Diagnosis not present

## 2023-08-22 DIAGNOSIS — R296 Repeated falls: Secondary | ICD-10-CM | POA: Insufficient documentation

## 2023-08-22 DIAGNOSIS — R2681 Unsteadiness on feet: Secondary | ICD-10-CM | POA: Diagnosis not present

## 2023-08-22 DIAGNOSIS — E119 Type 2 diabetes mellitus without complications: Secondary | ICD-10-CM | POA: Diagnosis not present

## 2023-08-22 DIAGNOSIS — R2689 Other abnormalities of gait and mobility: Secondary | ICD-10-CM | POA: Diagnosis not present

## 2023-08-22 DIAGNOSIS — M6281 Muscle weakness (generalized): Secondary | ICD-10-CM | POA: Insufficient documentation

## 2023-08-22 DIAGNOSIS — G8191 Hemiplegia, unspecified affecting right dominant side: Secondary | ICD-10-CM | POA: Diagnosis not present

## 2023-08-22 DIAGNOSIS — R208 Other disturbances of skin sensation: Secondary | ICD-10-CM | POA: Diagnosis not present

## 2023-08-22 DIAGNOSIS — R278 Other lack of coordination: Secondary | ICD-10-CM | POA: Diagnosis not present
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
## 2023-08-26 ENCOUNTER — Ambulatory Visit (HOSPITAL_COMMUNITY)
Admission: RE | Admit: 2023-08-26 | Discharge: 2023-08-26 | Disposition: A | Discharge status: Home / Self Care | Source: Ambulatory Visit | Attending: Vascular Surgery | Admitting: Vascular Surgery

## 2023-08-26 DIAGNOSIS — R234 Changes in skin texture: Secondary | ICD-10-CM | POA: Diagnosis not present

## 2023-08-26 DIAGNOSIS — R0989 Other specified symptoms and signs involving the circulatory and respiratory systems: Secondary | ICD-10-CM | POA: Insufficient documentation

## 2023-08-26 LAB — VAS US ABI WITH/WO TBI
Left ABI: 1.22
Right ABI: 1.02

## 2023-08-30 ENCOUNTER — Encounter: Payer: Self-pay | Admitting: Physical Therapy

## 2023-08-30 ENCOUNTER — Other Ambulatory Visit: Payer: Self-pay

## 2023-08-30 ENCOUNTER — Ambulatory Visit: Admitting: Occupational Therapy

## 2023-08-30 ENCOUNTER — Ambulatory Visit: Admitting: Physical Therapy

## 2023-08-30 DIAGNOSIS — R293 Abnormal posture: Secondary | ICD-10-CM

## 2023-08-30 DIAGNOSIS — R278 Other lack of coordination: Secondary | ICD-10-CM | POA: Diagnosis not present

## 2023-08-30 DIAGNOSIS — R2689 Other abnormalities of gait and mobility: Secondary | ICD-10-CM

## 2023-08-30 DIAGNOSIS — G8191 Hemiplegia, unspecified affecting right dominant side: Secondary | ICD-10-CM

## 2023-08-30 DIAGNOSIS — R2681 Unsteadiness on feet: Secondary | ICD-10-CM | POA: Diagnosis not present

## 2023-08-30 DIAGNOSIS — M6281 Muscle weakness (generalized): Secondary | ICD-10-CM

## 2023-08-30 DIAGNOSIS — R296 Repeated falls: Secondary | ICD-10-CM | POA: Diagnosis not present

## 2023-08-30 DIAGNOSIS — R208 Other disturbances of skin sensation: Secondary | ICD-10-CM | POA: Diagnosis not present
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
## 2023-08-30 NOTE — Patient Instructions (Signed)
  Would recommend back rest and arm rests.     Few options for elevated toilet seats from just arm rests, to toilet riser.

## 2023-08-31 ENCOUNTER — Encounter: Payer: Self-pay | Admitting: Podiatrist

## 2023-09-01 ENCOUNTER — Encounter: Admitting: Occupational Therapy

## 2023-09-02 ENCOUNTER — Ambulatory Visit: Admitting: Physical Therapy

## 2023-09-02 ENCOUNTER — Ambulatory Visit: Admitting: Occupational Therapy

## 2023-09-02 ENCOUNTER — Encounter: Payer: Self-pay | Admitting: Physical Therapy

## 2023-09-02 DIAGNOSIS — R208 Other disturbances of skin sensation: Secondary | ICD-10-CM

## 2023-09-02 DIAGNOSIS — R2689 Other abnormalities of gait and mobility: Secondary | ICD-10-CM

## 2023-09-02 DIAGNOSIS — M6281 Muscle weakness (generalized): Secondary | ICD-10-CM | POA: Diagnosis not present

## 2023-09-02 DIAGNOSIS — G8191 Hemiplegia, unspecified affecting right dominant side: Secondary | ICD-10-CM | POA: Diagnosis not present

## 2023-09-02 DIAGNOSIS — R278 Other lack of coordination: Secondary | ICD-10-CM

## 2023-09-02 DIAGNOSIS — R293 Abnormal posture: Secondary | ICD-10-CM | POA: Diagnosis not present

## 2023-09-02 DIAGNOSIS — R2681 Unsteadiness on feet: Secondary | ICD-10-CM | POA: Diagnosis not present

## 2023-09-02 DIAGNOSIS — R296 Repeated falls: Secondary | ICD-10-CM | POA: Diagnosis not present
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
## 2023-09-05 ENCOUNTER — Encounter: Payer: Self-pay | Admitting: Physical Therapy

## 2023-09-05 ENCOUNTER — Ambulatory Visit: Admitting: Occupational Therapy

## 2023-09-05 ENCOUNTER — Ambulatory Visit: Admitting: Physical Therapy

## 2023-09-05 ENCOUNTER — Telehealth: Payer: Self-pay | Admitting: Physical Therapy

## 2023-09-05 DIAGNOSIS — M6281 Muscle weakness (generalized): Secondary | ICD-10-CM

## 2023-09-05 DIAGNOSIS — R2689 Other abnormalities of gait and mobility: Secondary | ICD-10-CM

## 2023-09-05 DIAGNOSIS — R208 Other disturbances of skin sensation: Secondary | ICD-10-CM | POA: Diagnosis not present

## 2023-09-05 DIAGNOSIS — R278 Other lack of coordination: Secondary | ICD-10-CM

## 2023-09-05 DIAGNOSIS — G8191 Hemiplegia, unspecified affecting right dominant side: Secondary | ICD-10-CM | POA: Diagnosis not present

## 2023-09-05 DIAGNOSIS — R293 Abnormal posture: Secondary | ICD-10-CM | POA: Diagnosis not present

## 2023-09-05 DIAGNOSIS — R2681 Unsteadiness on feet: Secondary | ICD-10-CM | POA: Diagnosis not present

## 2023-09-05 DIAGNOSIS — R296 Repeated falls: Secondary | ICD-10-CM | POA: Diagnosis not present

## 2023-09-05 NOTE — Patient Instructions (Signed)
  Search: [employer] (can purchase on [employer] or [employer] for $5-10, look for button hook with larger handle like above)
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
## 2023-09-05 NOTE — Telephone Encounter (Signed)
 Dr. Judith Novak, Wilmot Haskell is being treated by physical therapy for frequent falls, hx of R hemiparesis.  He  will benefit from use of Right AFO in order to improve safety with functional mobility.  He would benefit from AFO consult to improve foot drop gait pattern, improve timing and coordination of gait, improve gait efficiency, and to decrease his risk of and frequency of falls.  If you agree, please submit request in EPIC under MD Order, Other Orders (list Right AFO in comments) or fax to [facility] at [phone].  In addition, Mr. Puerto will need to make a face to face visit with you in order for you to document/justify the need for this AFO.  Included in your face to face visit note, also needs to be the ICD-10 code, your signature and NPI.   Please let me know if you have additional questions.  Thank you, Kelsey Patricia., PT Dessie Flow, PT 09/05/23 12:05 PM Phone: [phone] Fax: [phone]  [facility] at [facility] [address] [place], [address] [place], [place] [address] Phone # [phone] Fax # [phone]

## 2023-09-06 NOTE — Telephone Encounter (Signed)
 Inquiring further to clarify documentation needs.  Requested [facility] Green Team schedule visit for documentation of medical necessity for Right AFO, will document as requested when evaluated.

## 2023-09-07 ENCOUNTER — Ambulatory Visit: Admitting: Occupational Therapy

## 2023-09-07 ENCOUNTER — Ambulatory Visit

## 2023-09-07 VITALS — BP 120/72 | HR 66

## 2023-09-07 DIAGNOSIS — M6281 Muscle weakness (generalized): Secondary | ICD-10-CM

## 2023-09-07 DIAGNOSIS — R2681 Unsteadiness on feet: Secondary | ICD-10-CM | POA: Diagnosis not present

## 2023-09-07 DIAGNOSIS — R293 Abnormal posture: Secondary | ICD-10-CM

## 2023-09-07 DIAGNOSIS — R296 Repeated falls: Secondary | ICD-10-CM | POA: Diagnosis not present

## 2023-09-07 DIAGNOSIS — R278 Other lack of coordination: Secondary | ICD-10-CM | POA: Diagnosis not present

## 2023-09-07 DIAGNOSIS — R2689 Other abnormalities of gait and mobility: Secondary | ICD-10-CM | POA: Diagnosis not present

## 2023-09-07 DIAGNOSIS — R208 Other disturbances of skin sensation: Secondary | ICD-10-CM | POA: Diagnosis not present

## 2023-09-07 DIAGNOSIS — G8191 Hemiplegia, unspecified affecting right dominant side: Secondary | ICD-10-CM | POA: Diagnosis not present
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
## 2023-09-09 ENCOUNTER — Ambulatory Visit: Admitting: Family Medicine

## 2023-09-09 ENCOUNTER — Ambulatory Visit: Attending: Vascular Surgery | Admitting: Vascular Surgery

## 2023-09-09 ENCOUNTER — Encounter: Payer: Self-pay | Admitting: Vascular Surgery

## 2023-09-09 ENCOUNTER — Encounter: Payer: Self-pay | Admitting: Family Medicine

## 2023-09-09 VITALS — BP 113/70 | HR 75 | Temp 98.4°F | Resp 18 | Ht 76.0 in | Wt 226.8 lb

## 2023-09-09 VITALS — BP 134/94 | HR 70 | Ht 76.0 in | Wt 228.2 lb

## 2023-09-09 DIAGNOSIS — N529 Male erectile dysfunction, unspecified: Secondary | ICD-10-CM

## 2023-09-09 DIAGNOSIS — G8191 Hemiplegia, unspecified affecting right dominant side: Secondary | ICD-10-CM

## 2023-09-09 DIAGNOSIS — R296 Repeated falls: Secondary | ICD-10-CM

## 2023-09-09 DIAGNOSIS — K219 Gastro-esophageal reflux disease without esophagitis: Secondary | ICD-10-CM | POA: Diagnosis not present

## 2023-09-09 DIAGNOSIS — I739 Peripheral vascular disease, unspecified: Secondary | ICD-10-CM | POA: Diagnosis not present

## 2023-09-09 DIAGNOSIS — R030 Elevated blood-pressure reading, without diagnosis of hypertension: Secondary | ICD-10-CM | POA: Diagnosis not present

## 2023-09-09 DIAGNOSIS — S91109A Unspecified open wound of unspecified toe(s) without damage to nail, initial encounter: Secondary | ICD-10-CM

## 2023-09-09 DIAGNOSIS — R0989 Other specified symptoms and signs involving the circulatory and respiratory systems: Secondary | ICD-10-CM | POA: Diagnosis not present

## 2023-09-09 MED ORDER — PANTOPRAZOLE SODIUM 20 MG PO TBEC: 20.0000 mg | DELAYED_RELEASE_TABLET | Freq: Every day | ORAL | 3 refills | Status: DC

## 2023-09-09 MED ORDER — SILDENAFIL CITRATE 100 MG PO TABS: ORAL_TABLET | ORAL | 2 refills | Status: DC

## 2023-09-09 NOTE — Progress Notes (Signed)
 SUBJECTIVE:   CHIEF COMPLAINT / HPI:  Mark Cook is a 68 y.o. male with a pertinent past medical history of T2DM w/ polyneuroapthy, PAD, chronic R hemiparesis, and frequent falls presenting to the clinic for discussion of AFO, frequent falls, and heart burn.  Frequent falls/AFO I received message from Dessie Flow, [facility] requesting order and visit for right AFO. Patient has been undergoing PT at this location and therapist believes this device would be beneficial to reduce patient's frequent falls. Patient has significant foot drop with ambulation, which both he and PT believe is contributing significantly to his falls. AFO really helped for patient when he tried ambulating with it, provided him support and relief he has not felt "in years." Patient continuing to trip and fall frequently, a few times a month. Consistently has weakness on his right side and struggles to ambulate.  PAD Patient was seen by Podiatry on 4/9 for eschar of R hallux, mechanically debrided all nails, referred to [facility] for vascular study. evaluated by [facility] earlier today. ABI both R and L determined to be WNL. Per [facility], patient has sufficient toe blood pressure to heal eschar/wound.  Heart burn Patient has been experiencing heart burn symptoms. Feels a burning pain on left side/in center of chest after meals when lying flat. Has been eating late in the evening, around 8 pm, goes to bed 30 minutes later. Frequently (approx 4-5 times a week), feels the burning sensation shortly after lying down. Takes some Tums of Pepto Bismol, achieves moderate relief with these. Feels some burping in the mornings. Does eat some spicy food, but not frequently. No dyspnea with exertion. No chest pain with exertion or at rest other than directly after meals when lying down.   PERTINENT PMH / PSH: Chronic R hemiparesis secondary to history of reported spinal injury at age 89 Frequent  falls PAD s/p amputation of second toe of left foot T2DM with diabetic polyneuropathy Osteoarthritis   OBJECTIVE:   BP (!) 134/94   Pulse 70   Ht 6\' 4"  (1.93 m)   Wt 228 lb 3.2 oz (103.5 kg)   SpO2 97%   BMI 27.78 kg/m   General: Age-appropriate, resting comfortably in chair, NAD, alert and at baseline. Cardiovascular: Regular rate and rhythm. Normal S1/S2. No murmurs, rubs, or gallops appreciated. 2+ radial pulses. Extremities: No peripheral edema bilaterally. Capillary refill <2 seconds. 2+ DP and PT pulses bilaterally.  Area of eschar healing on R hallux. MSK: R foot drop with ambulation, requires cane for support on R side.  3/5 R side strength compared to 5/5 L side in both UE and LE.   ASSESSMENT/PLAN:   Assessment & Plan Falls frequently Patient would benefit significantly from right foot AFO to reduce incidence of falls given his drop foot and difficulty ambulating in setting of R hemiparesis. -AFO R foot order placed per PT specifications -NPI: [id number] Right hemiparesis (HCC) As per falls above. Gastroesophageal reflux disease without esophagitis Symptoms c/w GERD, no concern for cardiac etiology.  Fairly severe symptoms given frequency of onset and severity. -Trial pantoprazole 20 mg daily, can switch to H2B in the future at follow up if doing well -Tums for breakthrough symptoms Erectile dysfunction, unspecified erectile dysfunction type -Refilled Viagra  50 mg daily PRN -Discussed Cialis, patient prefers to maintain current medication regimen -Counseled on priapism risks, does not take Nitroglycerin  PAD (peripheral artery disease) (HCC) ABIs WNL, VVS reports adequate perfusion for wound healing. Patient has Podiatry  follow up on 09/13/2023 and later in July. Foot healing eschar healing well today. Patient will continue daily foot exams. Elevated blood pressure reading Patient left prior to BP recheck, follow up next visit.  Return in about 2 months (around  11/09/2023) for GERD, falls, T2DM.  Brittyn Salaz Lansing Planas, MD [facility]

## 2023-09-09 NOTE — Assessment & Plan Note (Signed)
 ABIs WNL, VVS reports adequate perfusion for wound healing. Patient has Podiatry follow up on 09/13/2023 and later in July. Foot healing eschar healing well today. Patient will continue daily foot exams.

## 2023-09-09 NOTE — Patient Instructions (Addendum)
 It was great to see you today! Thank you for choosing [facility] for your primary care.  Today we addressed: 1. Falls frequently Pt will call you about the orthotic, I have placed the order.  GERD Start taking your pantoprazole to see if you have relief.  You should return to our clinic in 2 months.  Thank you for coming to see us  at [facility] and for the opportunity to care for you! Lansing Planas, Kaoru Benda, MD 09/09/2023, 4:22 PM

## 2023-09-09 NOTE — Assessment & Plan Note (Deleted)
 well controlled - Last A1c:  Lab Results  Component Value Date   HGBA1C 6.1 08/11/2023  - Medications: None

## 2023-09-09 NOTE — Assessment & Plan Note (Addendum)
 Patient would benefit significantly from right foot AFO to reduce incidence of falls given his drop foot and difficulty ambulating in setting of R hemiparesis. -AFO R foot order placed per PT specifications -NPI: [id number]

## 2023-09-09 NOTE — Assessment & Plan Note (Signed)
-  Refilled Viagra  50 mg daily PRN -Discussed Cialis, patient prefers to maintain current medication regimen -Counseled on priapism risks, does not take Nitroglycerin

## 2023-09-09 NOTE — Assessment & Plan Note (Signed)
 As per falls above.

## 2023-09-12 ENCOUNTER — Ambulatory Visit: Attending: Family Medicine | Admitting: Occupational Therapy

## 2023-09-12 ENCOUNTER — Ambulatory Visit

## 2023-09-12 DIAGNOSIS — R2689 Other abnormalities of gait and mobility: Secondary | ICD-10-CM | POA: Insufficient documentation

## 2023-09-12 DIAGNOSIS — R2681 Unsteadiness on feet: Secondary | ICD-10-CM | POA: Insufficient documentation

## 2023-09-12 DIAGNOSIS — G8191 Hemiplegia, unspecified affecting right dominant side: Secondary | ICD-10-CM | POA: Insufficient documentation

## 2023-09-12 DIAGNOSIS — M6281 Muscle weakness (generalized): Secondary | ICD-10-CM | POA: Insufficient documentation

## 2023-09-12 DIAGNOSIS — R208 Other disturbances of skin sensation: Secondary | ICD-10-CM | POA: Insufficient documentation

## 2023-09-12 DIAGNOSIS — R278 Other lack of coordination: Secondary | ICD-10-CM | POA: Insufficient documentation

## 2023-09-13 ENCOUNTER — Other Ambulatory Visit
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
## 2023-09-14 ENCOUNTER — Ambulatory Visit: Admitting: Occupational Therapy

## 2023-09-14 ENCOUNTER — Encounter: Payer: Self-pay | Admitting: Physical Therapy

## 2023-09-14 ENCOUNTER — Ambulatory Visit: Admitting: Physical Therapy

## 2023-09-14 DIAGNOSIS — R278 Other lack of coordination: Secondary | ICD-10-CM | POA: Diagnosis not present

## 2023-09-14 DIAGNOSIS — M6281 Muscle weakness (generalized): Secondary | ICD-10-CM

## 2023-09-14 DIAGNOSIS — G8191 Hemiplegia, unspecified affecting right dominant side: Secondary | ICD-10-CM

## 2023-09-14 DIAGNOSIS — R2689 Other abnormalities of gait and mobility: Secondary | ICD-10-CM

## 2023-09-14 DIAGNOSIS — R2681 Unsteadiness on feet: Secondary | ICD-10-CM

## 2023-09-14 DIAGNOSIS — R208 Other disturbances of skin sensation: Secondary | ICD-10-CM | POA: Diagnosis not present
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
## 2023-09-19 ENCOUNTER — Ambulatory Visit: Admitting: Physical Therapy

## 2023-09-19 ENCOUNTER — Ambulatory Visit: Admitting: Occupational Therapy

## 2023-09-19 ENCOUNTER — Encounter: Payer: Self-pay | Admitting: Physical Therapy

## 2023-09-19 DIAGNOSIS — R2681 Unsteadiness on feet: Secondary | ICD-10-CM | POA: Diagnosis not present

## 2023-09-19 DIAGNOSIS — R2689 Other abnormalities of gait and mobility: Secondary | ICD-10-CM

## 2023-09-19 DIAGNOSIS — R208 Other disturbances of skin sensation: Secondary | ICD-10-CM | POA: Diagnosis not present

## 2023-09-19 DIAGNOSIS — G8191 Hemiplegia, unspecified affecting right dominant side: Secondary | ICD-10-CM

## 2023-09-19 DIAGNOSIS — M6281 Muscle weakness (generalized): Secondary | ICD-10-CM | POA: Diagnosis not present

## 2023-09-19 DIAGNOSIS — R278 Other lack of coordination: Secondary | ICD-10-CM | POA: Diagnosis not present
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
## 2023-09-19 NOTE — Patient Instructions (Signed)

## 2023-09-21 ENCOUNTER — Ambulatory Visit: Admitting: Occupational Therapy

## 2023-09-21 ENCOUNTER — Ambulatory Visit: Admitting: Physical Therapy
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
## 2023-09-26 ENCOUNTER — Encounter: Payer: Self-pay | Admitting: Physical Therapy

## 2023-09-26 ENCOUNTER — Ambulatory Visit: Admitting: Occupational Therapy

## 2023-09-26 ENCOUNTER — Ambulatory Visit: Admitting: Physical Therapy

## 2023-09-26 DIAGNOSIS — R2681 Unsteadiness on feet: Secondary | ICD-10-CM | POA: Diagnosis not present

## 2023-09-26 DIAGNOSIS — R208 Other disturbances of skin sensation: Secondary | ICD-10-CM | POA: Diagnosis not present

## 2023-09-26 DIAGNOSIS — R278 Other lack of coordination: Secondary | ICD-10-CM

## 2023-09-26 DIAGNOSIS — G8191 Hemiplegia, unspecified affecting right dominant side: Secondary | ICD-10-CM | POA: Diagnosis not present

## 2023-09-26 DIAGNOSIS — M6281 Muscle weakness (generalized): Secondary | ICD-10-CM

## 2023-09-26 DIAGNOSIS — R2689 Other abnormalities of gait and mobility: Secondary | ICD-10-CM | POA: Diagnosis not present
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
## 2023-09-26 NOTE — Patient Instructions (Signed)
Sit to Stand Transfers:  Scoot out to the edge of the chair Place your feet flat on the floor, shoulder width apart.  Make sure your feet are tucked just under your knees. Lean forward (nose over toes) with momentum, and stand up tall with your best posture.  If you need to use your arms, use them as a quick boost up to stand. If you are in a low or soft chair, you can lean back and then forward up to stand, in order to get more momentum. Once you are standing, make sure you are looking ahead and standing tall.  To sit down:  Back up until you feel the chair behind your legs. Bend at your hips, reaching  Back for you chair, if needed, then slowly squat to sit down on your chair.  
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
## 2023-09-28 ENCOUNTER — Ambulatory Visit: Admitting: Occupational Therapy

## 2023-09-28 ENCOUNTER — Ambulatory Visit: Admitting: Physical Therapy

## 2023-09-28 ENCOUNTER — Telehealth: Payer: Self-pay | Admitting: Occupational Therapy

## 2023-09-28 NOTE — Telephone Encounter (Signed)
 Pt no-showed for OT appointment today, which is second no-show. Therefore, OT called pt's listed phone number [phone]). Pt reported forgetting about appointments today. OT provided time of upcoming PT appointment today though pt reported he will not be able to attend. OT provided date/times of next PT/OT appointments. Pt verbalized understanding.

## 2023-10-04 ENCOUNTER — Ambulatory Visit: Admitting: Occupational Therapy

## 2023-10-04 ENCOUNTER — Ambulatory Visit

## 2023-10-06 ENCOUNTER — Ambulatory Visit: Admitting: Occupational Therapy

## 2023-10-06 ENCOUNTER — Telehealth: Payer: Self-pay | Admitting: Occupational Therapy

## 2023-10-06 ENCOUNTER — Ambulatory Visit

## 2023-10-06 NOTE — Telephone Encounter (Signed)
 Pt no-showed for OT appointment today. Therefore, OT called pt's listed phone number [phone]) however pt did not answer and VM box full so unable to leave a message.  Pt is currently scheduled for OT and PT appt's 10/14/23, this is the last scheduled appt for each discipline.
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
## 2023-10-14 ENCOUNTER — Ambulatory Visit: Admitting: Occupational Therapy

## 2023-10-14 ENCOUNTER — Ambulatory Visit: Attending: Family Medicine

## 2023-11-01 ENCOUNTER — Telehealth: Payer: Self-pay

## 2023-11-01 DIAGNOSIS — N529 Male erectile dysfunction, unspecified: Secondary | ICD-10-CM

## 2023-11-01 NOTE — Telephone Encounter (Signed)
 Patient calls nurse line requesting updated prescription on Viagra .   He states that he has been taking 1 whole tablet, as half tablet was not working.   He is requesting updated rx for 1 whole tablet dosing.   Will forward request to PCP.   Chiquita JAYSON English, RN

## 2023-11-03 MED ORDER — SILDENAFIL CITRATE 100 MG PO TABS: 100.0000 mg | ORAL_TABLET | Freq: Every day | ORAL | 3 refills | Status: DC | PRN

## 2023-11-03 NOTE — Telephone Encounter (Signed)
 Called patient to advise that provider had sent in refill of medication. He did not answer.   If he calls back, please advise that refill has been sent to pharmacy.   Patient has scheduled follow up on 6/30.  Chiquita JAYSON English, RN

## 2023-11-07 ENCOUNTER — Ambulatory Visit: Admitting: Family Medicine
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
## 2023-11-10 ENCOUNTER — Other Ambulatory Visit: Payer: Self-pay | Admitting: Family Medicine

## 2023-11-10 DIAGNOSIS — G8191 Hemiplegia, unspecified affecting right dominant side: Secondary | ICD-10-CM

## 2023-11-10 DIAGNOSIS — M545 Low back pain, unspecified: Secondary | ICD-10-CM

## 2023-11-16 ENCOUNTER — Ambulatory Visit: Admitting: Podiatry

## 2023-12-05 ENCOUNTER — Other Ambulatory Visit: Payer: Self-pay | Admitting: Family Medicine

## 2023-12-05 DIAGNOSIS — K219 Gastro-esophageal reflux disease without esophagitis: Secondary | ICD-10-CM

## 2024-02-22 ENCOUNTER — Encounter: Payer: Self-pay | Admitting: Family Medicine

## 2024-02-22 ENCOUNTER — Ambulatory Visit (INDEPENDENT_AMBULATORY_CARE_PROVIDER_SITE_OTHER): Admitting: Family Medicine

## 2024-02-22 VITALS — BP 111/64 | HR 81 | Ht 76.0 in | Wt 227.2 lb

## 2024-02-22 DIAGNOSIS — K219 Gastro-esophageal reflux disease without esophagitis: Secondary | ICD-10-CM

## 2024-02-22 DIAGNOSIS — R296 Repeated falls: Secondary | ICD-10-CM

## 2024-02-22 DIAGNOSIS — R2681 Unsteadiness on feet: Secondary | ICD-10-CM | POA: Diagnosis not present

## 2024-02-22 DIAGNOSIS — M25551 Pain in right hip: Secondary | ICD-10-CM | POA: Diagnosis not present

## 2024-02-22 DIAGNOSIS — E1142 Type 2 diabetes mellitus with diabetic polyneuropathy: Secondary | ICD-10-CM | POA: Diagnosis not present

## 2024-02-22 LAB — POCT GLYCOSYLATED HEMOGLOBIN (HGB A1C): HbA1c, POC (controlled diabetic range): 6.4 % (ref 0.0–7.0)

## 2024-02-22 MED ORDER — KETOROLAC TROMETHAMINE 30 MG/ML IJ SOLN
30.0000 mg | Freq: Once | INTRAMUSCULAR | Status: AC
  Administered 2024-02-22: 30 mg via INTRAMUSCULAR

## 2024-02-22 MED ORDER — PANTOPRAZOLE SODIUM 20 MG PO TBEC: 20.0000 mg | DELAYED_RELEASE_TABLET | Freq: Every day | ORAL | 1 refills | Status: AC

## 2024-02-22 MED ORDER — KETOROLAC TROMETHAMINE 15 MG/ML IJ SOLN: 15.0000 mg | Freq: Once | INTRAMUSCULAR | Status: DC
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
## 2024-02-22 NOTE — Assessment & Plan Note (Addendum)
 Likely multifactorial, related to patient's hemiparesis and gait instability.  Continue to fall frequently.  High risk for injury. - Repeat course of PT, referral provided - Again providing the following documentation to assist with AFO procurement: Patient would benefit significantly from right foot AFO to reduce incidence of falls given his drop foot and difficulty ambulating in setting of R hemiparesis. -AFO R foot order placed per PT specifications -NPI: [id number]

## 2024-02-22 NOTE — Patient Instructions (Addendum)
 It was great to see you today! Thank you for choosing [facility] for your primary care.  Today we addressed: Diabetes Your A1c is well controlled today, keep up your good work!  Reflux I believe the symptoms you are describing in your throat are due to reflux.  Please take your pantoprazole  daily for at least 1-2 months and let us  know if the symptoms improve or do not.  If they do not, we would do more workup.  Would also like to get a blood count today just to make sure that things are going well from that standpoint.  Right hip pain I am referring you to orthopedic surgery for evaluation.  I do not think they will do surgery at this time but we will see.  I am also referring you to physical therapy as below.  We have given you a Toradol  injection today which should help you for a few weeks with your pain.  Falls, gait instability I will refer you to physical therapy to help you stabilize on your feet a bit more.  They should also be able to help you with your ankle-foot orthotic device.  Skin tags You can always come back to get the skin tags under your armpit removed if needed.  Hospital records number: [id number]  We are checking some labs today, including blood count.  You will get a MyChart message or a letter if results are normal. Otherwise, you will get a call from us .  If you had a referral placed, they will call you to set up an appointment. Please give us  a call if you don't hear back in the next 2 weeks.  You should return to our clinic in 6 months or sooner if needed.  Thank you for coming to see us  at [facility] and for the opportunity to care for you! Toma, Minah Axelrod, MD 02/22/2024, 10:34 AM

## 2024-02-22 NOTE — Assessment & Plan Note (Signed)
 A1c 6.4 today, mildly increased from prior but still adequate control with diet.  Would benefit from metformin , patient prefers diet control. - Patient has a diagnosis of PAD and T2DM, highly recommended being on statin but patient declined at this time - Continue dietary interventions

## 2024-02-23 ENCOUNTER — Ambulatory Visit (HOSPITAL_BASED_OUTPATIENT_CLINIC_OR_DEPARTMENT_OTHER): Payer: Self-pay | Admitting: Family Medicine

## 2024-02-23 LAB — CBC
Hematocrit: 41.6 % (ref 37.5–51.0)
Hemoglobin: 13.9 g/dL (ref 13.0–17.7)
MCH: 30.3 pg (ref 26.6–33.0)
MCHC: 33.4 g/dL (ref 31.5–35.7)
MCV: 91 fL (ref 79–97)
Platelets: 147 x10E3/uL — ABNORMAL LOW (ref 150–450)
RBC: 4.59 x10E6/uL (ref 4.14–5.80)
RDW: 13.8 % (ref 11.6–15.4)
WBC: 5 x10E3/uL (ref 3.4–10.8)

## 2024-03-21 DIAGNOSIS — M67951 Unspecified disorder of synovium and tendon, right thigh: Secondary | ICD-10-CM | POA: Diagnosis not present

## 2024-03-21 DIAGNOSIS — M1712 Unilateral primary osteoarthritis, left knee: Secondary | ICD-10-CM | POA: Diagnosis not present

## 2024-03-26 ENCOUNTER — Ambulatory Visit

## 2024-04-02 DIAGNOSIS — M1712 Unilateral primary osteoarthritis, left knee: Secondary | ICD-10-CM | POA: Diagnosis not present

## 2024-04-02 DIAGNOSIS — M67951 Unspecified disorder of synovium and tendon, right thigh: Secondary | ICD-10-CM | POA: Diagnosis not present

## 2024-04-09 DIAGNOSIS — M67951 Unspecified disorder of synovium and tendon, right thigh: Secondary | ICD-10-CM | POA: Diagnosis not present

## 2024-04-09 DIAGNOSIS — M1712 Unilateral primary osteoarthritis, left knee: Secondary | ICD-10-CM | POA: Diagnosis not present

## 2024-04-11 DIAGNOSIS — M1712 Unilateral primary osteoarthritis, left knee: Secondary | ICD-10-CM | POA: Diagnosis not present

## 2024-04-11 DIAGNOSIS — M67951 Unspecified disorder of synovium and tendon, right thigh: Secondary | ICD-10-CM | POA: Diagnosis not present

## 2024-04-17 ENCOUNTER — Telehealth: Payer: Self-pay

## 2024-04-17 NOTE — Progress Notes (Signed)
 Addendum 04/17/2024  Patient's gait instability and falls are secondary to drop foot noted in physical exam during initial visit, requiring AFO for treatment.

## 2024-04-17 NOTE — Telephone Encounter (Signed)
 Received call to nurse line from [facility].   They report needing an updated AFO order for  the (r) foot.  She reports the order from 5/2 will not work for insurance purposes.  Will need to send to 10/15 OV with order as well.   Fax: [phone].  Will forward to PCP to place updated DME order.   Will fax order and documentation to [facility].

## 2024-04-18 NOTE — Telephone Encounter (Signed)
 Signed and placed in medical records  Suzann Daring, MD  [facility]

## 2024-04-26 ENCOUNTER — Other Ambulatory Visit: Payer: Self-pay | Admitting: Family Medicine

## 2024-04-26 DIAGNOSIS — N529 Male erectile dysfunction, unspecified: Secondary | ICD-10-CM

## 2024-05-02 ENCOUNTER — Encounter (HOSPITAL_BASED_OUTPATIENT_CLINIC_OR_DEPARTMENT_OTHER): Payer: Self-pay | Admitting: Emergency Medicine

## 2024-05-02 ENCOUNTER — Other Ambulatory Visit: Payer: Self-pay

## 2024-05-02 ENCOUNTER — Emergency Department (HOSPITAL_BASED_OUTPATIENT_CLINIC_OR_DEPARTMENT_OTHER)
Admission: EM | Admit: 2024-05-02 | Discharge: 2024-05-02 | Disposition: A | Discharge status: Home / Self Care | Attending: Emergency Medicine | Admitting: Emergency Medicine

## 2024-05-02 ENCOUNTER — Emergency Department (HOSPITAL_BASED_OUTPATIENT_CLINIC_OR_DEPARTMENT_OTHER): Admitting: Radiology

## 2024-05-02 DIAGNOSIS — M546 Pain in thoracic spine: Secondary | ICD-10-CM | POA: Diagnosis present

## 2024-05-02 DIAGNOSIS — R0789 Other chest pain: Secondary | ICD-10-CM | POA: Diagnosis not present

## 2024-05-02 MED ORDER — IBUPROFEN 800 MG PO TABS
800.0000 mg | ORAL_TABLET | Freq: Once | ORAL | Status: AC
  Administered 2024-05-02: 800 mg via ORAL
  Filled 2024-05-02: qty 1
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
## 2024-05-25 ENCOUNTER — Ambulatory Visit: Payer: Self-pay | Admitting: Family Medicine

## 2024-05-25 ENCOUNTER — Encounter: Payer: Self-pay | Admitting: Family Medicine

## 2024-05-25 VITALS — BP 128/74 | HR 65 | Ht 76.0 in | Wt 240.0 lb

## 2024-05-25 DIAGNOSIS — M542 Cervicalgia: Secondary | ICD-10-CM

## 2024-05-25 DIAGNOSIS — Z87828 Personal history of other (healed) physical injury and trauma: Secondary | ICD-10-CM | POA: Diagnosis not present

## 2024-05-25 DIAGNOSIS — S14109A Unspecified injury at unspecified level of cervical spinal cord, initial encounter: Secondary | ICD-10-CM
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
## 2024-05-25 NOTE — Patient Instructions (Addendum)
 It was great to see you today! Thank you for choosing [facility] for your primary care.  Today we addressed: Cervical spine pain I have ordered your CT scan, you will receive a call when it is time to schedule it. Please use the lidocaine  patches in the painful area for 12 hours at a time. Try a warm heating pack on your shoulder and neck. Please limit your acetaminophen  (Tylenol ) intake to 4000 mg maximum. You can add naproxen (Alleve), 1 pill every 6 hours, do not take for more than 2-4 weeks. I will refer you to physical therapy.  They will call you to set up an appointment. Please give us  a call if you don't hear back in the next 2 weeks.  You should return to our clinic in 6 weeks.  Thank you for coming to see us  at [facility] and for the opportunity to care for you! Delorean Knutzen, MD 05/25/2024, 3:38 PM

## 2024-06-15 ENCOUNTER — Ambulatory Visit: Admission: RE | Admit: 2024-06-15 | Source: Ambulatory Visit

## 2024-06-15 ENCOUNTER — Ambulatory Visit: Payer: Self-pay | Admitting: Family Medicine

## 2024-06-15 DIAGNOSIS — S14109A Unspecified injury at unspecified level of cervical spinal cord, initial encounter: Secondary | ICD-10-CM

## 2024-06-15 DIAGNOSIS — M542 Cervicalgia: Secondary | ICD-10-CM

## 2024-06-15 DIAGNOSIS — Z87828 Personal history of other (healed) physical injury and trauma: Secondary | ICD-10-CM

## 2024-06-18 ENCOUNTER — Ambulatory Visit

## 2024-07-06 ENCOUNTER — Ambulatory Visit: Payer: Self-pay | Admitting: Family Medicine
